# Patient Record
Sex: Female | Born: 1965
Health system: Southern US, Community
[De-identification: ages and names within clinical notes are randomized; demographics above are authoritative.]

## PROBLEM LIST (undated history)

## (undated) DIAGNOSIS — R600 Localized edema: Secondary | ICD-10-CM

## (undated) DIAGNOSIS — D649 Anemia, unspecified: Secondary | ICD-10-CM

## (undated) DIAGNOSIS — J45909 Unspecified asthma, uncomplicated: Secondary | ICD-10-CM

## (undated) DIAGNOSIS — R011 Cardiac murmur, unspecified: Secondary | ICD-10-CM

## (undated) DIAGNOSIS — M549 Dorsalgia, unspecified: Secondary | ICD-10-CM

## (undated) DIAGNOSIS — K219 Gastro-esophageal reflux disease without esophagitis: Secondary | ICD-10-CM

## (undated) HISTORY — DX: Anemia, unspecified: D64.9

## (undated) HISTORY — DX: Localized edema: R60.0

## (undated) HISTORY — DX: Dorsalgia, unspecified: M54.9

## (undated) HISTORY — PX: WISDOM TOOTH EXTRACTION: SHX21

## (undated) HISTORY — DX: Gastro-esophageal reflux disease without esophagitis: K21.9

## (undated) HISTORY — DX: Cardiac murmur, unspecified: R01.1

## (undated) HISTORY — DX: Unspecified asthma, uncomplicated: J45.909

---

## 1999-07-04 ENCOUNTER — Other Ambulatory Visit: Admission: RE | Admit: 1999-07-04 | Discharge: 1999-07-04 | Payer: Self-pay | Admitting: Family Medicine

## 2000-11-17 ENCOUNTER — Other Ambulatory Visit: Admission: RE | Admit: 2000-11-17 | Discharge: 2000-11-17 | Payer: Self-pay | Admitting: Family Medicine

## 2005-05-29 ENCOUNTER — Emergency Department (HOSPITAL_COMMUNITY): Admission: EM | Admit: 2005-05-29 | Discharge: 2005-05-29 | Payer: Self-pay | Admitting: Emergency Medicine

## 2006-03-27 ENCOUNTER — Emergency Department (HOSPITAL_COMMUNITY): Admission: EM | Admit: 2006-03-27 | Discharge: 2006-03-27 | Payer: Self-pay | Admitting: Family Medicine

## 2009-08-18 ENCOUNTER — Emergency Department (HOSPITAL_COMMUNITY): Admission: EM | Admit: 2009-08-18 | Discharge: 2009-08-18 | Payer: Self-pay | Admitting: Emergency Medicine

## 2010-09-07 ENCOUNTER — Encounter (INDEPENDENT_AMBULATORY_CARE_PROVIDER_SITE_OTHER): Payer: Self-pay | Admitting: Internal Medicine

## 2010-09-07 ENCOUNTER — Ambulatory Visit: Payer: Self-pay | Admitting: Cardiology

## 2010-09-07 ENCOUNTER — Ambulatory Visit (HOSPITAL_COMMUNITY)
Admission: RE | Admit: 2010-09-07 | Discharge: 2010-09-07 | Payer: Self-pay | Source: Home / Self Care | Admitting: Internal Medicine

## 2011-01-09 LAB — WET PREP, GENITAL
Trich, Wet Prep: NONE SEEN
Yeast Wet Prep HPF POC: NONE SEEN

## 2011-01-09 LAB — URINALYSIS, ROUTINE W REFLEX MICROSCOPIC
Glucose, UA: NEGATIVE mg/dL
Nitrite: NEGATIVE
Specific Gravity, Urine: 1.029 (ref 1.005–1.030)
pH: 6.5 (ref 5.0–8.0)

## 2011-01-09 LAB — GC/CHLAMYDIA PROBE AMP, GENITAL
Chlamydia, DNA Probe: NEGATIVE
GC Probe Amp, Genital: NEGATIVE

## 2011-04-17 ENCOUNTER — Ambulatory Visit: Payer: No Typology Code available for payment source | Attending: Sports Medicine | Admitting: Physical Therapy

## 2011-04-17 DIAGNOSIS — M545 Low back pain, unspecified: Secondary | ICD-10-CM | POA: Insufficient documentation

## 2011-04-17 DIAGNOSIS — IMO0001 Reserved for inherently not codable concepts without codable children: Secondary | ICD-10-CM | POA: Insufficient documentation

## 2011-04-17 DIAGNOSIS — M2560 Stiffness of unspecified joint, not elsewhere classified: Secondary | ICD-10-CM | POA: Insufficient documentation

## 2011-04-17 DIAGNOSIS — M542 Cervicalgia: Secondary | ICD-10-CM | POA: Insufficient documentation

## 2011-04-19 ENCOUNTER — Ambulatory Visit: Payer: No Typology Code available for payment source | Admitting: Rehabilitative and Restorative Service Providers"

## 2011-04-24 ENCOUNTER — Ambulatory Visit: Payer: No Typology Code available for payment source | Admitting: Physical Therapy

## 2011-04-25 ENCOUNTER — Ambulatory Visit: Payer: No Typology Code available for payment source | Admitting: Physical Therapy

## 2011-04-29 ENCOUNTER — Ambulatory Visit: Payer: No Typology Code available for payment source | Admitting: Physical Therapy

## 2011-05-01 ENCOUNTER — Ambulatory Visit: Payer: No Typology Code available for payment source | Admitting: Physical Therapy

## 2011-05-08 ENCOUNTER — Ambulatory Visit: Payer: No Typology Code available for payment source | Attending: Sports Medicine | Admitting: Physical Therapy

## 2011-05-08 DIAGNOSIS — M542 Cervicalgia: Secondary | ICD-10-CM | POA: Insufficient documentation

## 2011-05-08 DIAGNOSIS — M2560 Stiffness of unspecified joint, not elsewhere classified: Secondary | ICD-10-CM | POA: Insufficient documentation

## 2011-05-08 DIAGNOSIS — M545 Low back pain, unspecified: Secondary | ICD-10-CM | POA: Insufficient documentation

## 2011-05-08 DIAGNOSIS — IMO0001 Reserved for inherently not codable concepts without codable children: Secondary | ICD-10-CM | POA: Insufficient documentation

## 2011-05-10 ENCOUNTER — Ambulatory Visit: Payer: No Typology Code available for payment source | Admitting: Physical Therapy

## 2011-05-13 ENCOUNTER — Encounter: Payer: No Typology Code available for payment source | Admitting: Physical Therapy

## 2011-05-15 ENCOUNTER — Ambulatory Visit: Payer: No Typology Code available for payment source | Admitting: Physical Therapy

## 2011-05-20 ENCOUNTER — Ambulatory Visit: Payer: No Typology Code available for payment source | Admitting: Physical Therapy

## 2011-05-22 ENCOUNTER — Ambulatory Visit: Payer: No Typology Code available for payment source | Admitting: Physical Therapy

## 2011-05-28 ENCOUNTER — Ambulatory Visit: Payer: No Typology Code available for payment source | Admitting: Physical Therapy

## 2011-05-30 ENCOUNTER — Ambulatory Visit: Payer: No Typology Code available for payment source | Admitting: Physical Therapy

## 2011-10-09 ENCOUNTER — Other Ambulatory Visit: Payer: Self-pay | Admitting: Sports Medicine

## 2011-10-09 DIAGNOSIS — M545 Low back pain, unspecified: Secondary | ICD-10-CM

## 2011-10-15 ENCOUNTER — Inpatient Hospital Stay: Admission: RE | Admit: 2011-10-15 | Payer: No Typology Code available for payment source | Source: Ambulatory Visit

## 2011-11-01 ENCOUNTER — Ambulatory Visit: Payer: Medicaid Other | Admitting: Physical Medicine & Rehabilitation

## 2011-11-05 ENCOUNTER — Encounter: Payer: Medicaid Other | Attending: Physical Medicine & Rehabilitation

## 2011-11-05 ENCOUNTER — Ambulatory Visit (HOSPITAL_BASED_OUTPATIENT_CLINIC_OR_DEPARTMENT_OTHER): Payer: Medicaid Other | Admitting: Physical Medicine & Rehabilitation

## 2011-11-05 DIAGNOSIS — M545 Low back pain, unspecified: Secondary | ICD-10-CM

## 2011-11-05 DIAGNOSIS — G8921 Chronic pain due to trauma: Secondary | ICD-10-CM
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
## 2011-11-07 ENCOUNTER — Other Ambulatory Visit: Payer: No Typology Code available for payment source

## 2011-11-25 ENCOUNTER — Ambulatory Visit: Payer: Medicaid Other | Admitting: Physical Therapy

## 2011-12-10 ENCOUNTER — Encounter: Payer: Medicaid Other | Attending: Physical Medicine & Rehabilitation

## 2011-12-10 ENCOUNTER — Ambulatory Visit: Payer: Medicaid Other | Admitting: Physical Medicine & Rehabilitation

## 2011-12-10 DIAGNOSIS — M545 Low back pain, unspecified: Secondary | ICD-10-CM | POA: Insufficient documentation

## 2015-02-09 ENCOUNTER — Ambulatory Visit (INDEPENDENT_AMBULATORY_CARE_PROVIDER_SITE_OTHER): Payer: 59 | Admitting: Internal Medicine

## 2015-02-09 ENCOUNTER — Encounter: Payer: Self-pay | Admitting: Internal Medicine

## 2015-02-09 ENCOUNTER — Ambulatory Visit (INDEPENDENT_AMBULATORY_CARE_PROVIDER_SITE_OTHER)
Admission: RE | Admit: 2015-02-09 | Discharge: 2015-02-09 | Disposition: A | Discharge status: Home / Self Care | Payer: 59 | Source: Ambulatory Visit | Attending: Internal Medicine | Admitting: Internal Medicine

## 2015-02-09 VITALS — BP 120/74 | HR 68 | Temp 98.5°F | Ht 66.0 in | Wt 315.0 lb

## 2015-02-09 DIAGNOSIS — M25561 Pain in right knee: Secondary | ICD-10-CM | POA: Diagnosis not present

## 2015-02-09 DIAGNOSIS — J4520 Mild intermittent asthma, uncomplicated: Secondary | ICD-10-CM | POA: Diagnosis not present

## 2015-02-09 DIAGNOSIS — K219 Gastro-esophageal reflux disease without esophagitis: Secondary | ICD-10-CM

## 2015-02-09 DIAGNOSIS — J45909 Unspecified asthma, uncomplicated: Secondary | ICD-10-CM | POA: Insufficient documentation

## 2015-02-09 NOTE — Progress Notes (Signed)
Pre visit review using our clinic review tool, if applicable. No additional management support is needed unless otherwise documented below in the visit note. 

## 2015-02-09 NOTE — Assessment & Plan Note (Signed)
Diet controlled Discussed how weight loss would benefit her reflux

## 2015-02-09 NOTE — Assessment & Plan Note (Signed)
Has not affected her adult life

## 2015-02-09 NOTE — Patient Instructions (Signed)

## 2015-02-09 NOTE — Progress Notes (Signed)
HPI  Pt presents to the clinic today to establish care and for management of the conditions listed below. She has not had a PCP in many years.  Childhood asthma: Has not affected her as an adult. She does not use an inhaler.  GERD: She reports this is triggered by almost everything that she eats. She used to take Tums occasionally. She is now able to control it with her diet.   She is concerned about pain in her right knee. It seems worse first thing in the morning and after sitting for long periods of time. She reports it feels stiff but gets better throughout the day. She is not having any trouble walking. She denies injury to the area. She does feel like they are swelling. She has taken a friend's "fluid pill" with some relief.  Flu: never Tetanus: < 10 years ago Pap Smear: She thinks in 2011 Mammogram: She thinks in 2013 Colon Screening: never Vision Screening: yearly Dentist: no, she has dentures  Past Medical History  Diagnosis Date  . Childhood asthma   . GERD (gastroesophageal reflux disease)   . Heart murmur     Current Outpatient Prescriptions  Medication Sig Dispense Refill  . ferrous sulfate 325 (65 FE) MG tablet Take 325 mg by mouth daily with breakfast.     No current facility-administered medications for this visit.    Allergies  Allergen Reactions  . No Known Drug Allergy     History reviewed. No pertinent family history.  History   Social History  . Marital Status: Single    Spouse Name: N/A  . Number of Children: N/A  . Years of Education: N/A   Occupational History  . Not on file.   Social History Main Topics  . Smoking status: Never Smoker   . Smokeless tobacco: Never Used  . Alcohol Use: No  . Drug Use: Not on file  . Sexual Activity: Not on file   Other Topics Concern  . Not on file   Social History Narrative  . No narrative on file    ROS:  Constitutional: Denies fever, malaise, fatigue, headache or abrupt weight changes.   Respiratory: Denies difficulty breathing, shortness of breath, cough or sputum production.   Cardiovascular: Denies chest pain, chest tightness, palpitations or swelling in the hands or feet.  Gastrointestinal: Denies abdominal pain, bloating, constipation, diarrhea or blood in the stool.  Musculoskeletal: Pt reports knee pain. Denies decrease in range of motion, difficulty with gait, muscle pain.  Skin: Denies redness, rashes, lesions or ulcercations.  Neurological: Denies dizziness, difficulty with memory, difficulty with speech or problems with balance and coordination.  Psych: She denies anxiety, depression, SI/HI.  No other specific complaints in a complete review of systems (except as listed in HPI above).  PE:  BP 120/74 mmHg  Pulse 68  Temp(Src) 98.5 F (36.9 C) (Oral)  Ht 5\' 6"  (1.676 m)  Wt 315 lb (142.883 kg)  BMI 50.87 kg/m2  SpO2 98%  LMP 02/02/2015 Wt Readings from Last 3 Encounters:  02/09/15 315 lb (142.883 kg)    General: Appears her stated age, obese in NAD. Skin: Warm, dry and intact. Cardiovascular: Normal rate and rhythm. S1,S2 noted.  No murmur, rubs or gallops noted. Pulmonary/Chest: Normal effort and positive vesicular breath sounds. No respiratory distress. No wheezes, rales or ronchi noted.  Abdomen: Soft and nontender. Normal bowel sounds, no bruits noted. No distention or masses noted. Liver, spleen and kidneys non palpable. Musculoskeletal: Normal flexion and extension  of the right knee. Minimal swelling of the right lateral knee. No crepitus noted with ROM. No pain with palpation. No difficulty with gait.  Neurological: Alert and oriented.  Psychiatric: Mood normal but affect seems somewhat flat.   Assessment and Plan:  Right knee pain:  Worsened by her morbid obesity, encouraged her to work on diet and exercise Likely OA Will obtain xray of right knee today Advised her to try Ibuprofen  Make an appt for an annual exam

## 2015-02-23 ENCOUNTER — Encounter: Payer: Self-pay | Admitting: Internal Medicine

## 2015-02-23 ENCOUNTER — Other Ambulatory Visit (HOSPITAL_COMMUNITY)
Admission: RE | Admit: 2015-02-23 | Discharge: 2015-02-23 | Disposition: A | Discharge status: Home / Self Care | Payer: 59 | Source: Ambulatory Visit | Attending: Internal Medicine | Admitting: Internal Medicine

## 2015-02-23 ENCOUNTER — Ambulatory Visit (INDEPENDENT_AMBULATORY_CARE_PROVIDER_SITE_OTHER): Payer: 59 | Admitting: Internal Medicine

## 2015-02-23 VITALS — BP 122/84 | HR 68 | Temp 98.4°F | Ht 66.0 in | Wt 310.0 lb

## 2015-02-23 DIAGNOSIS — Z01419 Encounter for gynecological examination (general) (routine) without abnormal findings: Secondary | ICD-10-CM | POA: Diagnosis present

## 2015-02-23 DIAGNOSIS — Z1151 Encounter for screening for human papillomavirus (HPV): Secondary | ICD-10-CM | POA: Insufficient documentation

## 2015-02-23 DIAGNOSIS — Z1239 Encounter for other screening for malignant neoplasm of breast: Secondary | ICD-10-CM

## 2015-02-23 DIAGNOSIS — Z0000 Encounter for general adult medical examination without abnormal findings: Secondary | ICD-10-CM

## 2015-02-23 DIAGNOSIS — Z124 Encounter for screening for malignant neoplasm of cervix: Secondary | ICD-10-CM | POA: Diagnosis not present

## 2015-02-23 LAB — COMPREHENSIVE METABOLIC PANEL
ALBUMIN: 4 g/dL (ref 3.5–5.2)
ALT: 13 U/L (ref 0–35)
AST: 17 U/L (ref 0–37)
Alkaline Phosphatase: 53 U/L (ref 39–117)
BILIRUBIN TOTAL: 0.4 mg/dL (ref 0.2–1.2)
BUN: 16 mg/dL (ref 6–23)
CHLORIDE: 104 meq/L (ref 96–112)
CO2: 25 meq/L (ref 19–32)
Calcium: 9.3 mg/dL (ref 8.4–10.5)
Creatinine, Ser: 0.92 mg/dL (ref 0.40–1.20)
GFR: 82.71 mL/min (ref >60.00)
Glucose, Bld: 85 mg/dL (ref 70–99)
Potassium: 3.8 mEq/L (ref 3.5–5.1)
SODIUM: 135 meq/L (ref 135–145)
TOTAL PROTEIN: 7.5 g/dL (ref 6.0–8.3)

## 2015-02-23 LAB — CBC
HEMATOCRIT: 34 % — AB (ref 36.0–46.0)
HEMOGLOBIN: 10.9 g/dL — AB (ref 12.0–15.0)
MCHC: 32.1 g/dL (ref 30.0–36.0)
MCV: 83.3 fl (ref 78.0–100.0)
PLATELETS: 346 10*3/uL (ref 150.0–400.0)
RBC: 4.09 Mil/uL (ref 3.87–5.11)
RDW: 15.5 % (ref 11.5–15.5)
WBC: 5.7 10*3/uL (ref 4.0–10.5)

## 2015-02-23 LAB — HEMOGLOBIN A1C: Hgb A1c MFr Bld: 5.8 % (ref 4.6–6.5)

## 2015-02-23 LAB — TSH: TSH: 0.79 u[IU]/mL (ref 0.35–4.50)

## 2015-02-23 LAB — LIPID PANEL
CHOLESTEROL: 161 mg/dL (ref 0–200)
HDL: 58.8 mg/dL (ref >39.00)
LDL Cholesterol: 83 mg/dL (ref 0–99)
NonHDL: 102.2
TRIGLYCERIDES: 98 mg/dL (ref 0.0–149.0)
Total CHOL/HDL Ratio: 3
VLDL: 19.6 mg/dL (ref 0.0–40.0)

## 2015-02-23 NOTE — Progress Notes (Signed)
Pre visit review using our clinic review tool, if applicable. No additional management support is needed unless otherwise documented below in the visit note. 

## 2015-02-23 NOTE — Progress Notes (Signed)
Subjective:    Patient ID: Wendy Wagner, female    DOB: [birth year], 49 y.o.   MRN: [id number]  HPI  Pt presents to the clinic today for her annual exam.  Flu: never Tetanus: < 10 years ago Pap Smear: She thinks in 2011 Mammogram: She thinks in 2013 Colon Screening: never Vision Screening: yearly Dentist: no, she has dentures  Review of Systems      Past Medical History  Diagnosis Date  . Childhood asthma   . GERD (gastroesophageal reflux disease)   . Heart murmur     Current Outpatient Prescriptions  Medication Sig Dispense Refill  . ferrous sulfate 325 (65 FE) MG tablet Take 325 mg by mouth daily with breakfast.     No current facility-administered medications for this visit.    Allergies  Allergen Reactions  . No Known Drug Allergy     Family History  Problem Relation Age of Onset  . Cancer Mother     brain  . Cancer Father     brain, lung  . Diabetes Neg Hx   . Heart disease Neg Hx   . Stroke Neg Hx     History   Social History  . Marital Status: Single    Spouse Name: N/A  . Number of Children: N/A  . Years of Education: N/A   Occupational History  . Not on file.   Social History Main Topics  . Smoking status: Never Smoker   . Smokeless tobacco: Never Used  . Alcohol Use: No  . Drug Use: No  . Sexual Activity: Not Currently   Other Topics Concern  . Not on file   Social History Narrative     Constitutional: Denies fever, malaise, fatigue, headache or abrupt weight changes.  HEENT: Denies eye pain, eye redness, ear pain, ringing in the ears, wax buildup, runny nose, nasal congestion, bloody nose, or sore throat. Respiratory: Denies difficulty breathing, shortness of breath, cough or sputum production.   Cardiovascular: Denies chest pain, chest tightness, palpitations or swelling in the hands or feet.  Gastrointestinal: Denies abdominal pain, bloating, constipation, diarrhea or blood in the stool.  GU: Denies urgency, frequency,  pain with urination, burning sensation, blood in urine, odor or discharge. Musculoskeletal: Denies decrease in range of motion, difficulty with gait, muscle pain or joint pain and swelling.  Skin: Denies redness, rashes, lesions or ulcercations.  Neurological: Denies dizziness, difficulty with memory, difficulty with speech or problems with balance and coordination.  Psych: Denies anxiety, depression, SI/HI.  No other specific complaints in a complete review of systems (except as listed in HPI above).  Objective:   Physical Exam   BP 122/84 mmHg  Pulse 68  Temp(Src) 98.4 F (36.9 C) (Oral)  Ht 5\' 6"  (1.676 m)  Wt 310 lb (140.615 kg)  BMI 50.06 kg/m2  SpO2 98%  LMP 02/02/2015 Wt Readings from Last 3 Encounters:  02/23/15 310 lb (140.615 kg)  02/09/15 315 lb (142.883 kg)    General: Appears her stated age, obese in NAD. Skin: Warm, dry and intact. No rashes, lesions or ulcerations noted. HEENT: Head: normal shape and size; Eyes: sclera white, no icterus, conjunctiva pink, PERRLA and EOMs intact; Ears: Tm's gray and intact, normal light reflex; Throat/Mouth: Teeth present, mucosa pink and moist, no exudate, lesions or ulcerations noted.  Neck: Neck supple, trachea midline. No masses, lumps or thyromegaly present.  Cardiovascular: Normal rate and rhythm. S1,S2 noted.  Murmur noted. No rubs or gallops noted. No JVD or BLE  edema. No carotid bruits noted. Pulmonary/Chest: Normal effort and positive vesicular breath sounds. No respiratory distress. No wheezes, rales or ronchi noted.  Abdomen: Soft and nontender. Normal bowel sounds, no bruits noted. No distention or masses noted. Liver, spleen and kidneys non palpable. Musculoskeletal: Strength 5/5 BUE/BLE.  No signs of joint swelling. No difficulty with gait.  Neurological: Alert and oriented. Cranial nerves II-XII grossly intact. Coordination normal. Psychiatric: Mood and affect normal. Behavior is normal. Judgment and thought content  normal.       Assessment & Plan:   Preventative Health Maintenance:  Encouraged her to work on diet and exercise She declines flu shot Tetanus UTD Pap smear today Will order Mammogram, she will call [facility] to schedule Will check CBC, CMET, Lipid, TSH, and A1C today She declines colonoscopy, but is agreeable to IFOB  RTC in 1 year or sooner if needed

## 2015-02-23 NOTE — Addendum Note (Signed)
Addended by: Lurlean Nanny on: 02/23/2015 03:04 PM   Modules accepted: Orders

## 2015-02-23 NOTE — Patient Instructions (Signed)

## 2015-02-24 LAB — CYTOLOGY - PAP

## 2015-03-30 ENCOUNTER — Ambulatory Visit (INDEPENDENT_AMBULATORY_CARE_PROVIDER_SITE_OTHER): Payer: 59 | Admitting: Internal Medicine

## 2015-03-30 ENCOUNTER — Encounter: Payer: Self-pay | Admitting: Internal Medicine

## 2015-03-30 VITALS — BP 126/78 | HR 70 | Temp 98.5°F | Wt 299.0 lb

## 2015-03-30 DIAGNOSIS — R609 Edema, unspecified: Secondary | ICD-10-CM | POA: Diagnosis not present

## 2015-03-30 DIAGNOSIS — S29012A Strain of muscle and tendon of back wall of thorax, initial encounter: Secondary | ICD-10-CM

## 2015-03-30 MED ORDER — HYDROCHLOROTHIAZIDE 12.5 MG PO CAPS: 12.5000 mg | ORAL_CAPSULE | Freq: Every day | ORAL | Status: DC

## 2015-03-30 MED ORDER — METHOCARBAMOL 500 MG PO TABS: 500.0000 mg | ORAL_TABLET | Freq: Every evening | ORAL | Status: DC | PRN

## 2015-03-30 NOTE — Progress Notes (Signed)
Subjective:    Patient ID: Wendy Wagner, female    DOB: 1966-08-27, 49 y.o.   MRN: [id number]  HPI  Pt presents to the clinic today with c/o back pain. The pain is worse on the right side it. She describes the pain as achy but can be sharp and stabbing at times. It is constant but seesms worse with certain movements like changing from a sitting to a standing position. She denies numbness and tingling. She denies any injury that she is aware of. It started at work just out of the blue. She has taken Aleve without relief.  She also c/o swelling in her feet and ankles. It gets worse throughout the day, but is sometimes still present when she wakes up in the morning. It is not painful but she has trouble getting into her shoes. She reports she has been on a fluid pill as needed for this in the past and wants to know if she can get a refill today.  Review of Systems      Past Medical History  Diagnosis Date  . Childhood asthma   . GERD (gastroesophageal reflux disease)   . Heart murmur     Current Outpatient Prescriptions  Medication Sig Dispense Refill  . ferrous sulfate 325 (65 FE) MG tablet Take 325 mg by mouth daily with breakfast.     No current facility-administered medications for this visit.    Allergies  Allergen Reactions  . No Known Drug Allergy     Family History  Problem Relation Age of Onset  . Cancer Mother     brain  . Cancer Father     brain, lung  . Diabetes Neg Hx   . Heart disease Neg Hx   . Stroke Neg Hx     History   Social History  . Marital Status: Single    Spouse Name: N/A  . Number of Children: N/A  . Years of Education: N/A   Occupational History  . Not on file.   Social History Main Topics  . Smoking status: Never Smoker   . Smokeless tobacco: Never Used  . Alcohol Use: No  . Drug Use: No  . Sexual Activity: Not Currently   Other Topics Concern  . Not on file   Social History Narrative     Constitutional: Denies fever,  malaise, fatigue, headache or abrupt weight changes.  Respiratory: Denies difficulty breathing, shortness of breath, cough or sputum production.   Cardiovascular: Pt reports swelling in the feet. Denies chest pain, chest tightness, palpitations or swelling in the hands.  Gastrointestinal: Denies abdominal pain, bloating, constipation, diarrhea or blood in the stool.  GU: Denies urgency, frequency, pain with urination, burning sensation, blood in urine, odor or discharge. Musculoskeletal: Pt reports back pain. Denies decrease in range of motion, difficulty with gait, or joint pain and swelling.  Skin: Denies redness, rashes, lesions or ulcercations.    No other specific complaints in a complete review of systems (except as listed in HPI above).  Objective:   Physical Exam   BP 126/78 mmHg  Pulse 70  Temp(Src) 98.5 F (36.9 C) (Oral)  Wt 299 lb (135.626 kg)  SpO2 98% Wt Readings from Last 3 Encounters:  03/30/15 299 lb (135.626 kg)  02/23/15 310 lb (140.615 kg)  02/09/15 315 lb (142.883 kg)    General: Appears her stated age, obese in NAD. Cardiovascular: Normal rate and rhythm. S1,S2 noted.  No murmur, rubs or gallops noted. 2+ non pitting BLE  edema.  Pulmonary/Chest: Normal effort and positive vesicular breath sounds. No respiratory distress. No wheezes, rales or ronchi noted.  Abdomen: Soft and nontender. Normal bowel sounds, no bruits noted. No distention or masses noted. Liver, spleen and kidneys non palpable. Musculoskeletal: Decreased flexion. Normal extension and rotation. No pain with palpation of the thoracic and lumbar spine. Pain with palpation of the right para thoracic muscles. No difficulty with gait.  Neurological: Alert and oriented. Sensation intact to BLE.  BMET    Component Value Date/Time   NA 135 02/23/2015 1512   K 3.8 02/23/2015 1512   CL 104 02/23/2015 1512   CO2 25 02/23/2015 1512   GLUCOSE 85 02/23/2015 1512   BUN 16 02/23/2015 1512   CREATININE  0.92 02/23/2015 1512   CALCIUM 9.3 02/23/2015 1512    Lipid Panel     Component Value Date/Time   CHOL 161 02/23/2015 1512   TRIG 98.0 02/23/2015 1512   HDL 58.80 02/23/2015 1512   CHOLHDL 3 02/23/2015 1512   VLDL 19.6 02/23/2015 1512   LDLCALC 83 02/23/2015 1512    CBC    Component Value Date/Time   WBC 5.7 02/23/2015 1512   RBC 4.09 02/23/2015 1512   HGB 10.9* 02/23/2015 1512   HCT 34.0* 02/23/2015 1512   PLT 346.0 02/23/2015 1512   MCV 83.3 02/23/2015 1512   MCHC 32.1 02/23/2015 1512   RDW 15.5 02/23/2015 1512    Hgb A1C Lab Results  Component Value Date   HGBA1C 5.8 02/23/2015        Assessment & Plan:   Muscles strain of right back:  Take Aleve twice daily eRx for Robaxin 500 mg QHS Stretching exercises given Reassurance given that this should resolve with time  Peripheral edema:  eRx for HCTZ 12.5 mg to be taken daily prn for swelling  RTC as needed or if symptoms persist or worsen

## 2015-03-30 NOTE — Progress Notes (Signed)
Pre visit review using our clinic review tool, if applicable. No additional management support is needed unless otherwise documented below in the visit note. 

## 2015-03-30 NOTE — Patient Instructions (Signed)
Back Exercises These exercises may help you when beginning to rehabilitate your injury. Your symptoms may resolve with or without further involvement from your physician, physical therapist or athletic trainer. While completing these exercises, remember:   Restoring tissue flexibility helps normal motion to return to the joints. This allows healthier, less painful movement and activity.  An effective stretch should be held for at least 30 seconds.  A stretch should never be painful. You should only feel a gentle lengthening or release in the stretched tissue. STRETCH - Extension, Prone on Elbows   Lie on your stomach on the floor, a bed will be too soft. Place your palms about shoulder width apart and at the height of your head.  Place your elbows under your shoulders. If this is too painful, stack pillows under your chest.  Allow your body to relax so that your hips drop lower and make contact more completely with the floor.  Hold this position for __________ seconds.  Slowly return to lying flat on the floor. Repeat __________ times. Complete this exercise __________ times per day.  RANGE OF MOTION - Extension, Prone Press Ups   Lie on your stomach on the floor, a bed will be too soft. Place your palms about shoulder width apart and at the height of your head.  Keeping your back as relaxed as possible, slowly straighten your elbows while keeping your hips on the floor. You may adjust the placement of your hands to maximize your comfort. As you gain motion, your hands will come more underneath your shoulders.  Hold this position __________ seconds.  Slowly return to lying flat on the floor. Repeat __________ times. Complete this exercise __________ times per day.  RANGE OF MOTION- Quadruped, Neutral Spine   Assume a hands and knees position on a firm surface. Keep your hands under your shoulders and your knees under your hips. You may place padding under your knees for  comfort.  Drop your head and point your tail bone toward the ground below you. This will round out your low back like an angry cat. Hold this position for __________ seconds.  Slowly lift your head and release your tail bone so that your back sags into a large arch, like an old horse.  Hold this position for __________ seconds.  Repeat this until you feel limber in your low back.  Now, find your "sweet spot." This will be the most comfortable position somewhere between the two previous positions. This is your neutral spine. Once you have found this position, tense your stomach muscles to support your low back.  Hold this position for __________ seconds. Repeat __________ times. Complete this exercise __________ times per day.  STRETCH - Flexion, Single Knee to Chest   Lie on a firm bed or floor with both legs extended in front of you.  Keeping one leg in contact with the floor, bring your opposite knee to your chest. Hold your leg in place by either grabbing behind your thigh or at your knee.  Pull until you feel a gentle stretch in your low back. Hold __________ seconds.  Slowly release your grasp and repeat the exercise with the opposite side. Repeat __________ times. Complete this exercise __________ times per day.  STRETCH - Hamstrings, Standing  Stand or sit and extend your right / left leg, placing your foot on a chair or foot stool  Keeping a slight arch in your low back and your hips straight forward.  Lead with your chest and   lean forward at the waist until you feel a gentle stretch in the back of your right / left knee or thigh. (When done correctly, this exercise requires leaning only a small distance.)  Hold this position for __________ seconds. Repeat __________ times. Complete this stretch __________ times per day. STRENGTHENING - Deep Abdominals, Pelvic Tilt   Lie on a firm bed or floor. Keeping your legs in front of you, bend your knees so they are both pointed  toward the ceiling and your feet are flat on the floor.  Tense your lower abdominal muscles to press your low back into the floor. This motion will rotate your pelvis so that your tail bone is scooping upwards rather than pointing at your feet or into the floor.  With a gentle tension and even breathing, hold this position for __________ seconds. Repeat __________ times. Complete this exercise __________ times per day.  STRENGTHENING - Abdominals, Crunches   Lie on a firm bed or floor. Keeping your legs in front of you, bend your knees so they are both pointed toward the ceiling and your feet are flat on the floor. Cross your arms over your chest.  Slightly tip your chin down without bending your neck.  Tense your abdominals and slowly lift your trunk high enough to just clear your shoulder blades. Lifting higher can put excessive stress on the low back and does not further strengthen your abdominal muscles.  Control your return to the starting position. Repeat __________ times. Complete this exercise __________ times per day.  STRENGTHENING - Quadruped, Opposite UE/LE Lift   Assume a hands and knees position on a firm surface. Keep your hands under your shoulders and your knees under your hips. You may place padding under your knees for comfort.  Find your neutral spine and gently tense your abdominal muscles so that you can maintain this position. Your shoulders and hips should form a rectangle that is parallel with the floor and is not twisted.  Keeping your trunk steady, lift your right hand no higher than your shoulder and then your left leg no higher than your hip. Make sure you are not holding your breath. Hold this position __________ seconds.  Continuing to keep your abdominal muscles tense and your back steady, slowly return to your starting position. Repeat with the opposite arm and leg. Repeat __________ times. Complete this exercise __________ times per day. Document Released:  10/11/2005 Document Revised: 12/16/2011 Document Reviewed: 01/05/2009 Exit[employer] Patient Information 2015 [employer]. This information is not intended to replace advice given to you by your health care provider. Make sure you discuss any questions you have with your health care provider.  

## 2015-08-10 ENCOUNTER — Ambulatory Visit (INDEPENDENT_AMBULATORY_CARE_PROVIDER_SITE_OTHER): Payer: 59 | Admitting: Family Medicine

## 2015-08-10 ENCOUNTER — Encounter: Payer: Self-pay | Admitting: Family Medicine

## 2015-08-10 VITALS — BP 104/60 | HR 71 | Temp 98.6°F | Wt 308.5 lb

## 2015-08-10 DIAGNOSIS — M25512 Pain in left shoulder: Secondary | ICD-10-CM | POA: Diagnosis not present

## 2015-08-10 DIAGNOSIS — M545 Low back pain, unspecified: Secondary | ICD-10-CM

## 2015-08-10 MED ORDER — CYCLOBENZAPRINE HCL 10 MG PO TABS
5.0000 mg | ORAL_TABLET | Freq: Three times a day (TID) | ORAL | Status: DC | PRN
Start: 2015-08-10 — End: 2016-02-13

## 2015-08-10 MED ORDER — IBUPROFEN 600 MG PO TABS: 600.0000 mg | ORAL_TABLET | Freq: Three times a day (TID) | ORAL | Status: DC | PRN
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
## 2015-08-10 NOTE — Patient Instructions (Addendum)
Ibuprofen with food for pain.  Muscle relaxer as needed.  Sedation caution.  Use the shoulder exercises and gradually try to stretch your hips and back.  Take care.

## 2015-08-11 DIAGNOSIS — S335XXA Sprain of ligaments of lumbar spine, initial encounter: Secondary | ICD-10-CM | POA: Insufficient documentation

## 2015-08-11 DIAGNOSIS — M25519 Pain in unspecified shoulder: Secondary | ICD-10-CM | POA: Insufficient documentation

## 2015-08-11 NOTE — Assessment & Plan Note (Signed)
Ibuprofen, flexeril, routine cautions.  Likely SI irritation with muscle spasm. Okay for outpatient f/u.  D/w pt. She agrees.  See AVS.

## 2015-08-11 NOTE — Assessment & Plan Note (Signed)
Likely cuff irritation, no arm drop.  D/w pt about anatomy and exercises, handout given and d/w pt.  Ibuprofen and home exercises, f/u prn.

## 2015-08-18 ENCOUNTER — Encounter: Payer: Self-pay | Admitting: Internal Medicine

## 2015-08-18 ENCOUNTER — Encounter: Payer: Self-pay | Admitting: *Deleted

## 2015-08-18 ENCOUNTER — Ambulatory Visit (INDEPENDENT_AMBULATORY_CARE_PROVIDER_SITE_OTHER): Payer: 59 | Admitting: Internal Medicine

## 2015-08-18 VITALS — BP 120/70 | HR 64 | Temp 97.5°F | Wt 311.0 lb

## 2015-08-18 DIAGNOSIS — S335XXD Sprain of ligaments of lumbar spine, subsequent encounter: Secondary | ICD-10-CM | POA: Diagnosis not present

## 2015-08-18 NOTE — Patient Instructions (Signed)
Please increase the ibuprofen to 600mg  three times daily. Try to find a better support bra.

## 2015-08-18 NOTE — Assessment & Plan Note (Signed)
Persistent problems for years--but ongoing exacerbation Will increase the ibuprofen Start PT Discussed getting better bra--as weight of breasts is probably adding to this

## 2015-08-18 NOTE — Progress Notes (Signed)
Pre visit review using our clinic review tool, if applicable. No additional management support is needed unless otherwise documented below in the visit note. 
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
## 2015-09-25 ENCOUNTER — Ambulatory Visit (INDEPENDENT_AMBULATORY_CARE_PROVIDER_SITE_OTHER): Payer: 59 | Admitting: Internal Medicine

## 2015-09-25 ENCOUNTER — Encounter: Payer: Self-pay | Admitting: Internal Medicine

## 2015-09-25 VITALS — BP 134/86 | HR 60 | Temp 98.3°F | Wt 318.0 lb

## 2015-09-25 DIAGNOSIS — J069 Acute upper respiratory infection, unspecified: Secondary | ICD-10-CM

## 2015-09-25 MED ORDER — HYDROCODONE-HOMATROPINE 5-1.5 MG PO TABS: 1.0000 | ORAL_TABLET | Freq: Three times a day (TID) | ORAL | Status: DC | PRN

## 2015-09-25 MED ORDER — AZITHROMYCIN 250 MG PO TABS: ORAL_TABLET | ORAL | Status: DC

## 2015-09-25 NOTE — Progress Notes (Signed)
Pre visit review using our clinic review tool, if applicable. No additional management support is needed unless otherwise documented below in the visit note. 

## 2015-09-25 NOTE — Progress Notes (Signed)
HPI  Pt presents to the clinic today with c/o cough and chest congestion. This started 9 days ago. The cough is productive of green/brown mucous. She denies shortness of breath. She has had a runny nose, with clear mucous. She denies fever, but has had chills and body aches. She has tried Research [employer], political party with minimal relief. She does have a history of childhood asthma but has not had to use an inhaler. She has had sick contacts.  Review of Systems      Past Medical History  Diagnosis Date  . Childhood asthma   . GERD (gastroesophageal reflux disease)   . Heart murmur     Family History  Problem Relation Age of Onset  . Cancer Mother     brain  . Cancer Father     brain, lung  . Diabetes Neg Hx   . Heart disease Neg Hx   . Stroke Neg Hx     Social History   Social History  . Marital Status: Single    Spouse Name: N/A  . Number of Children: N/A  . Years of Education: N/A   Occupational History  . Not on file.   Social History Main Topics  . Smoking status: Never Smoker   . Smokeless tobacco: Never Used  . Alcohol Use: No  . Drug Use: No  . Sexual Activity: Not Currently   Other Topics Concern  . Not on file   Social History Narrative    Allergies  Allergen Reactions  . No Known Drug Allergy      Constitutional: Positive fatigue. Denies headache, fever or abrupt weight changes.  HEENT:  Positive runny nose. Denies eye redness, eye pain, pressure behind the eyes, facial pain, nasal congestion, ear pain, ringing in the ears, wax buildup, or sore throat. Respiratory: Positive cough. Denies difficulty breathing or shortness of breath.  Cardiovascular: Denies chest pain, chest tightness, palpitations or swelling in the hands or feet.   No other specific complaints in a complete review of systems (except as listed in HPI above).  Objective:   BP 134/86 mmHg  Pulse 60  Temp(Src) 98.3 F (36.8 C) (Oral)  Wt 318 lb (144.244 kg) Wt Readings from  Last 3 Encounters:  09/25/15 318 lb (144.244 kg)  08/18/15 311 lb (141.069 kg)  08/10/15 308 lb 8 oz (139.935 kg)     General: Appears her stated age, in NAD. HEENT: Head: normal shape and size, no sinus tenderness noted; Eyes: sclera white, no icterus, conjunctiva pink; Ears: Tm's pink but intact, normal light reflex, + serous effusion bilaterally; Nose: mucosa boggy and moist, septum midline; Throat/Mouth: + PND. Teeth present, mucosa erythematous and moist, no exudate noted, no lesions or ulcerations noted.  Neck: No cervical lymphadenopathy.  Cardiovascular: Normal rate and rhythm. S1,S2 noted.  Pulmonary/Chest: Normal effort and positive vesicular breath sounds. No respiratory distress. No wheezes, rales or ronchi noted.      Assessment & Plan:   Upper Respiratory Infection:  Given duration of symptoms, will treat with abx Get some rest and drink plenty of water Flonase daily x 1 week for runny nose eRx for Azithromax x 5 days Rx for Hycodan cough tablets  RTC as needed or if symptoms persist.

## 2015-09-25 NOTE — Patient Instructions (Signed)
Upper Respiratory Infection, Adult Most upper respiratory infections (URIs) are a viral infection of the air passages leading to the lungs. A URI affects the nose, throat, and upper air passages. The most common type of URI is nasopharyngitis and is typically referred to as "the common cold." URIs run their course and usually go away on their own. Most of the time, a URI does not require medical attention, but sometimes a bacterial infection in the upper airways can follow a viral infection. This is called a secondary infection. Sinus and middle ear infections are common types of secondary upper respiratory infections. Bacterial pneumonia can also complicate a URI. A URI can worsen asthma and chronic obstructive pulmonary disease (COPD). Sometimes, these complications can require emergency medical care and may be life threatening.  CAUSES Almost all URIs are caused by viruses. A virus is a type of germ and can spread from one person to another.  RISKS FACTORS You may be at risk for a URI if:   You smoke.   You have chronic heart or lung disease.  You have a weakened defense (immune) system.   You are very young or very old.   You have nasal allergies or asthma.  You work in crowded or poorly ventilated areas.  You work in health care facilities or schools. SIGNS AND SYMPTOMS  Symptoms typically develop 2-3 days after you come in contact with a cold virus. Most viral URIs last 7-10 days. However, viral URIs from the influenza virus (flu virus) can last 14-18 days and are typically more severe. Symptoms may include:   Runny or stuffy (congested) nose.   Sneezing.   Cough.   Sore throat.   Headache.   Fatigue.   Fever.   Loss of appetite.   Pain in your forehead, behind your eyes, and over your cheekbones (sinus pain).  Muscle aches.  DIAGNOSIS  Your health care provider may diagnose a URI by:  Physical exam.  Tests to check that your symptoms are not due to  another condition such as:  Strep throat.  Sinusitis.  Pneumonia.  Asthma. TREATMENT  A URI goes away on its own with time. It cannot be cured with medicines, but medicines may be prescribed or recommended to relieve symptoms. Medicines may help:  Reduce your fever.  Reduce your cough.  Relieve nasal congestion. HOME CARE INSTRUCTIONS   Take medicines only as directed by your health care provider.   Gargle warm saltwater or take cough drops to comfort your throat as directed by your health care provider.  Use a warm mist humidifier or inhale steam from a shower to increase air moisture. This may make it easier to breathe.  Drink enough fluid to keep your urine clear or pale yellow.   Eat soups and other clear broths and maintain good nutrition.   Rest as needed.   Return to work when your temperature has returned to normal or as your health care provider advises. You may need to stay home longer to avoid infecting others. You can also use a face mask and careful hand washing to prevent spread of the virus.  Increase the usage of your inhaler if you have asthma.   Do not use any tobacco products, including cigarettes, chewing tobacco, or electronic cigarettes. If you need help quitting, ask your health care provider. PREVENTION  The best way to protect yourself from getting a cold is to practice good hygiene.   Avoid oral or hand contact with people with cold   symptoms.   Wash your hands often if contact occurs.  There is no clear evidence that vitamin C, vitamin E, echinacea, or exercise reduces the chance of developing a cold. However, it is always recommended to get plenty of rest, exercise, and practice good nutrition.  SEEK MEDICAL CARE IF:   You are getting worse rather than better.   Your symptoms are not controlled by medicine.   You have chills.  You have worsening shortness of breath.  You have brown or red mucus.  You have yellow or brown nasal  discharge.  You have pain in your face, especially when you bend forward.  You have a fever.  You have swollen neck glands.  You have pain while swallowing.  You have white areas in the back of your throat. SEEK IMMEDIATE MEDICAL CARE IF:   You have severe or persistent:  Headache.  Ear pain.  Sinus pain.  Chest pain.  You have chronic lung disease and any of the following:  Wheezing.  Prolonged cough.  Coughing up blood.  A change in your usual mucus.  You have a stiff neck.  You have changes in your:  Vision.  Hearing.  Thinking.  Mood. MAKE SURE YOU:   Understand these instructions.  Will watch your condition.  Will get help right away if you are not doing well or get worse.   This information is not intended to replace advice given to you by your health care provider. Make sure you discuss any questions you have with your health care provider.   Document Released: 03/19/2001 Document Revised: 02/07/2015 Document Reviewed: 12/29/2013 Elsevier Interactive Patient Education 2016 [employer]  

## 2015-09-26 ENCOUNTER — Telehealth: Payer: Self-pay

## 2015-09-26 NOTE — Telephone Encounter (Signed)
That is the cheapest one I am aware of that has codeine in it. If it is too expensive, I recommend Delsym or Nyquil OTC.

## 2015-09-26 NOTE — Telephone Encounter (Signed)
Pt left v/m; pt was seen 09/25/15 and the hycodan was too expensive and pt request less expensive med for cough. Pt request cb.

## 2015-09-26 NOTE — Telephone Encounter (Signed)
Left detailed msg on VM per HIPAA  

## 2016-02-13 ENCOUNTER — Ambulatory Visit (INDEPENDENT_AMBULATORY_CARE_PROVIDER_SITE_OTHER): Payer: BLUE CROSS/BLUE SHIELD | Admitting: Internal Medicine

## 2016-02-13 ENCOUNTER — Encounter: Payer: Self-pay | Admitting: Internal Medicine

## 2016-02-13 VITALS — BP 122/72 | HR 82 | Temp 98.5°F | Wt 329.5 lb

## 2016-02-13 DIAGNOSIS — R609 Edema, unspecified: Secondary | ICD-10-CM | POA: Diagnosis not present

## 2016-02-13 DIAGNOSIS — M546 Pain in thoracic spine: Secondary | ICD-10-CM | POA: Diagnosis not present

## 2016-02-13 MED ORDER — METHOCARBAMOL 500 MG PO TABS: 500.0000 mg | ORAL_TABLET | Freq: Three times a day (TID) | ORAL | Status: DC | PRN

## 2016-02-13 MED ORDER — IBUPROFEN 800 MG PO TABS: 800.0000 mg | ORAL_TABLET | Freq: Three times a day (TID) | ORAL | Status: DC | PRN
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
## 2016-02-13 NOTE — Patient Instructions (Signed)

## 2016-02-13 NOTE — Progress Notes (Signed)
Pre visit review using our clinic review tool, if applicable. No additional management support is needed unless otherwise documented below in the visit note. 

## 2016-02-23 ENCOUNTER — Ambulatory Visit (INDEPENDENT_AMBULATORY_CARE_PROVIDER_SITE_OTHER): Payer: BLUE CROSS/BLUE SHIELD

## 2016-02-23 ENCOUNTER — Ambulatory Visit (INDEPENDENT_AMBULATORY_CARE_PROVIDER_SITE_OTHER): Payer: BLUE CROSS/BLUE SHIELD | Admitting: Podiatry

## 2016-02-23 ENCOUNTER — Encounter: Payer: Self-pay | Admitting: Podiatry

## 2016-02-23 VITALS — BP 135/71 | HR 70 | Resp 18

## 2016-02-23 DIAGNOSIS — M7730 Calcaneal spur, unspecified foot: Secondary | ICD-10-CM | POA: Diagnosis not present

## 2016-02-23 DIAGNOSIS — R52 Pain, unspecified: Secondary | ICD-10-CM | POA: Diagnosis not present

## 2016-02-23 DIAGNOSIS — M722 Plantar fascial fibromatosis: Secondary | ICD-10-CM

## 2016-02-23 MED ORDER — METHYLPREDNISOLONE 4 MG PO TBPK: ORAL_TABLET | ORAL | Status: DC

## 2016-02-23 MED ORDER — MELOXICAM 15 MG PO TABS
15.0000 mg | ORAL_TABLET | Freq: Every day | ORAL | Status: DC
Start: 2016-02-23 — End: 2016-05-29

## 2016-02-23 NOTE — Patient Instructions (Signed)
Start taking the medrol dose pack once this is complete you can start the meloxicam (mobic) but do not take together. Do not take ibuprofen.   Plantar Fasciitis With Rehab The plantar fascia is a fibrous, ligament-like, soft-tissue structure that spans the bottom of the foot. Plantar fasciitis, also called heel spur syndrome, is a condition that causes pain in the foot due to inflammation of the tissue. SYMPTOMS   Pain and tenderness on the underneath side of the foot.  Pain that worsens with standing or walking. CAUSES  Plantar fasciitis is caused by irritation and injury to the plantar fascia on the underneath side of the foot. Common mechanisms of injury include:  Direct trauma to bottom of the foot.  Damage to a small nerve that runs under the foot where the main fascia attaches to the heel bone.  Stress placed on the plantar fascia due to bone spurs. RISK INCREASES WITH:   Activities that place stress on the plantar fascia (running, jumping, pivoting, or cutting).  Poor strength and flexibility.  Improperly fitted shoes.  Tight calf muscles.  Flat feet.  Failure to warm-up properly before activity.  Obesity. PREVENTION  Warm up and stretch properly before activity.  Allow for adequate recovery between workouts.  Maintain physical fitness:  Strength, flexibility, and endurance.  Cardiovascular fitness.  Maintain a health body weight.  Avoid stress on the plantar fascia.  Wear properly fitted shoes, including arch supports for individuals who have flat feet. PROGNOSIS  If treated properly, then the symptoms of plantar fasciitis usually resolve without surgery. However, occasionally surgery is necessary. RELATED COMPLICATIONS   Recurrent symptoms that may result in a chronic condition.  Problems of the lower back that are caused by compensating for the injury, such as limping.  Pain or weakness of the foot during push-off following surgery.  Chronic  inflammation, scarring, and partial or complete fascia tear, occurring more often from repeated injections. TREATMENT  Treatment initially involves the use of ice and medication to help reduce pain and inflammation. The use of strengthening and stretching exercises may help reduce pain with activity, especially stretches of the Achilles tendon. These exercises may be performed at home or with a therapist. Your caregiver may recommend that you use heel cups of arch supports to help reduce stress on the plantar fascia. Occasionally, corticosteroid injections are given to reduce inflammation. If symptoms persist for greater than 6 months despite non-surgical (conservative), then surgery may be recommended.  MEDICATION   If pain medication is necessary, then nonsteroidal anti-inflammatory medications, such as aspirin and ibuprofen, or other minor pain relievers, such as acetaminophen, are often recommended.  Do not take pain medication within 7 days before surgery.  Prescription pain relievers may be given if deemed necessary by your caregiver. Use only as directed and only as much as you need.  Corticosteroid injections may be given by your caregiver. These injections should be reserved for the most serious cases, because they may only be given a certain number of times. HEAT AND COLD  Cold treatment (icing) relieves pain and reduces inflammation. Cold treatment should be applied for 10 to 15 minutes every 2 to 3 hours for inflammation and pain and immediately after any activity that aggravates your symptoms. Use ice packs or massage the area with a piece of ice (ice massage).  Heat treatment may be used prior to performing the stretching and strengthening activities prescribed by your caregiver, physical therapist, or athletic trainer. Use a heat pack or soak the  injury in warm water. SEEK IMMEDIATE MEDICAL CARE IF:  Treatment seems to offer no benefit, or the condition worsens.  Any medications  produce adverse side effects. EXERCISES RANGE OF MOTION (ROM) AND STRETCHING EXERCISES - Plantar Fasciitis (Heel Spur Syndrome) These exercises may help you when beginning to rehabilitate your injury. Your symptoms may resolve with or without further involvement from your physician, physical therapist or athletic trainer. While completing these exercises, remember:   Restoring tissue flexibility helps normal motion to return to the joints. This allows healthier, less painful movement and activity.  An effective stretch should be held for at least 30 seconds.  A stretch should never be painful. You should only feel a gentle lengthening or release in the stretched tissue. RANGE OF MOTION - Toe Extension, Flexion  Sit with your right / left leg crossed over your opposite knee.  Grasp your toes and gently pull them back toward the top of your foot. You should feel a stretch on the bottom of your toes and/or foot.  Hold this stretch for __________ seconds.  Now, gently pull your toes toward the bottom of your foot. You should feel a stretch on the top of your toes and or foot.  Hold this stretch for __________ seconds. Repeat __________ times. Complete this stretch __________ times per day.  RANGE OF MOTION - Ankle Dorsiflexion, Active Assisted  Remove shoes and sit on a chair that is preferably not on a carpeted surface.  Place right / left foot under knee. Extend your opposite leg for support.  Keeping your heel down, slide your right / left foot back toward the chair until you feel a stretch at your ankle or calf. If you do not feel a stretch, slide your bottom forward to the edge of the chair, while still keeping your heel down.  Hold this stretch for __________ seconds. Repeat __________ times. Complete this stretch __________ times per day.  STRETCH - Gastroc, Standing  Place hands on wall.  Extend right / left leg, keeping the front knee somewhat bent.  Slightly point your  toes inward on your back foot.  Keeping your right / left heel on the floor and your knee straight, shift your weight toward the wall, not allowing your back to arch.  You should feel a gentle stretch in the right / left calf. Hold this position for __________ seconds. Repeat __________ times. Complete this stretch __________ times per day. STRETCH - Soleus, Standing  Place hands on wall.  Extend right / left leg, keeping the other knee somewhat bent.  Slightly point your toes inward on your back foot.  Keep your right / left heel on the floor, bend your back knee, and slightly shift your weight over the back leg so that you feel a gentle stretch deep in your back calf.  Hold this position for __________ seconds. Repeat __________ times. Complete this stretch __________ times per day. STRETCH - Gastrocsoleus, Standing  Note: This exercise can place a lot of stress on your foot and ankle. Please complete this exercise only if specifically instructed by your caregiver.   Place the ball of your right / left foot on a step, keeping your other foot firmly on the same step.  Hold on to the wall or a rail for balance.  Slowly lift your other foot, allowing your body weight to press your heel down over the edge of the step.  You should feel a stretch in your right / left calf.  Hold  this position for __________ seconds.  Repeat this exercise with a slight bend in your right / left knee. Repeat __________ times. Complete this stretch __________ times per day.  STRENGTHENING EXERCISES - Plantar Fasciitis (Heel Spur Syndrome)  These exercises may help you when beginning to rehabilitate your injury. They may resolve your symptoms with or without further involvement from your physician, physical therapist or athletic trainer. While completing these exercises, remember:   Muscles can gain both the endurance and the strength needed for everyday activities through controlled  exercises.  Complete these exercises as instructed by your physician, physical therapist or athletic trainer. Progress the resistance and repetitions only as guided. STRENGTH - Towel Curls  Sit in a chair positioned on a non-carpeted surface.  Place your foot on a towel, keeping your heel on the floor.  Pull the towel toward your heel by only curling your toes. Keep your heel on the floor.  If instructed by your physician, physical therapist or athletic trainer, add ____________________ at the end of the towel. Repeat __________ times. Complete this exercise __________ times per day. STRENGTH - Ankle Inversion  Secure one end of a rubber exercise band/tubing to a fixed object (table, pole). Loop the other end around your foot just before your toes.  Place your fists between your knees. This will focus your strengthening at your ankle.  Slowly, pull your big toe up and in, making sure the band/tubing is positioned to resist the entire motion.  Hold this position for __________ seconds.  Have your muscles resist the band/tubing as it slowly pulls your foot back to the starting position. Repeat __________ times. Complete this exercises __________ times per day.    This information is not intended to replace advice given to you by your health care provider. Make sure you discuss any questions you have with your health care provider.   Document Released: 09/23/2005 Document Revised: 02/07/2015 Document Reviewed: 01/05/2009 Elsevier Interactive Patient Education [employer].

## 2016-02-23 NOTE — Progress Notes (Signed)
   Subjective:    Patient ID: Wendy Wagner, female    DOB: 10/03/1966, 50 y.o.   MRN: [id number]  HPI  50 year old female presents the office they for complaints of bilateral foot pain which is been ongoing for greater than 6 months. She states that she has pain mostly in the heels when she first gets up in the morning or after periods of rest. She peters he had orthotics made several years ago but she does not know if they're located. She denies any recent injury or trauma. No swelling or redness. The pain does not wake her up at night. She has had no recent treatment. No other complaints at this time.  Review of Systems  All other systems reviewed and are negative.      Objective:   Physical Exam General: AAO x3, NAD  Dermatological: Skin is warm, dry and supple bilateral. Nails x 10 are well manicured; remaining integument appears unremarkable at this time. There are no open sores, no preulcerative lesions, no rash or signs of infection present.  Vascular: Dorsalis Pedis artery and Posterior Tibial artery pedal pulses are 2/4 bilateral with immedate capillary fill time. Pedal hair growth present. No varicosities and no lower extremity edema present bilateral. There is no pain with calf compression, swelling, warmth, erythema.   Neruologic: Grossly intact via light touch bilateral. Vibratory intact via tuning fork bilateral. Protective threshold with Semmes Wienstein monofilament intact to all pedal sites bilateral. Patellar and Achilles deep tendon reflexes 2+ bilateral. No Babinski or clonus noted bilateral.   Musculoskeletal: Tenderness to palpation along the plantar medial tubercle of the calcaneus at the insertion of plantar fascia on the left and right foot. There is no pain along the course of the plantar fascia within the arch of the foot. Plantar fascia appears to be intact. There is no pain with lateral compression of the calcaneus or pain with vibratory sensation. There is no pain  along the course or insertion of the achilles tendon. No other areas of tenderness to bilateral lower extremities. Equinus is present. Flatfoot is evident.    Gait: Unassisted, Nonantalgic.     Assessment & Plan:  50 year old female presents for bilateral heel pain, plantar fasciitis. -Treatment options discussed including all alternatives, risks, and complications -Etiology of symptoms were discussed -X-rays were obtained and reviewed with the patient. No evidence of acute fracture or stress fracture. -Declined steroid injection -Prescribed medrol dose pack mobic. She is not interested together to start with a steroid once this point she can take meloxicam. She verbally understood this. Discussed side effects of the medication and directed to stop if any are to occur and call the office.  -Plantar fascial braces -I discussed custom orthotics for the patient and she wishes to proceed with them. She was scanned for orthotics they were sent to [facility]. -Stretching and icing daily. -Discussed shoe gear modifications. -Follow-up in 3 weeks or sooner if any problems arise. In the meantime, encouraged to call the office with any questions, concerns, change in symptoms.   Celesta Gentile, DPM

## 2016-03-15 ENCOUNTER — Ambulatory Visit (INDEPENDENT_AMBULATORY_CARE_PROVIDER_SITE_OTHER): Payer: BLUE CROSS/BLUE SHIELD | Admitting: Podiatry

## 2016-03-15 ENCOUNTER — Encounter: Payer: Self-pay | Admitting: Podiatry

## 2016-03-15 DIAGNOSIS — M722 Plantar fascial fibromatosis: Secondary | ICD-10-CM | POA: Diagnosis not present

## 2016-03-20 NOTE — Progress Notes (Signed)
Patient ID: Wendy Wagner, female   DOB: 01-Jan-1966, 50 y.o.   MRN: [id number]  Subjective: 50 year old female presents the office today to pick up orthotics and for follow-up evaluation of bilateral heel pain. She does that she is doing better and the brace is helping. She states after the steroid the swelling to completely resolve her started to come back some. She states the swelling she points to the inside aspect of her foot just below the ankle. Denies any systemic complaints such as fevers, chills, nausea, vomiting. No acute changes since last appointment, and no other complaints at this time.   Objective: AAO x3, NAD DP/PT pulses palpable bilaterally, CRT less than 3 seconds This continuation tenderness along the plantar medial tubercle of the calcaneus at the insertion of plantar fascia. There is no pain along the plantar fascial in the arch of the foot. There appears to be some slight edema on the medial aspect of the foot as well as overlying this area without any erythema or increase in warmth. Mild equinus present. No other areas of tenderness. No areas of pinpoint bony tenderness or pain with vibratory sensation. MMT 5/5, ROM WNL. No edema, erythema, increase in warmth to bilateral lower extremities.  No open lesions or pre-ulcerative lesions.  No pain with calf compression, swelling, warmth, erythema  Assessment: Bilateral heel pain, likely plantar fasciitis.  Plan: -All treatment options discussed with the patient including all alternatives, risks, complications.  -Orthotics were dispensed today. Oral and written break in instructions were discussed. -She wishes to hold off on steroid injection. -Continue anti-inflammatories as needed -Continue stretching, icing. -Follow-up in 4 weeks if symptoms continue or sooner if any problems arise. In the meantime, encouraged to call the office with any questions, concerns, change in symptoms.   Celesta Gentile, DPM

## 2016-04-19 ENCOUNTER — Ambulatory Visit: Payer: BLUE CROSS/BLUE SHIELD | Admitting: Podiatry

## 2016-05-29 ENCOUNTER — Encounter: Payer: Self-pay | Admitting: Internal Medicine

## 2016-05-29 ENCOUNTER — Ambulatory Visit (INDEPENDENT_AMBULATORY_CARE_PROVIDER_SITE_OTHER): Payer: BLUE CROSS/BLUE SHIELD | Admitting: Internal Medicine

## 2016-05-29 VITALS — BP 128/78 | HR 87 | Temp 98.5°F | Ht 66.0 in | Wt 330.0 lb

## 2016-05-29 DIAGNOSIS — Z0000 Encounter for general adult medical examination without abnormal findings: Secondary | ICD-10-CM

## 2016-05-29 DIAGNOSIS — Z1159 Encounter for screening for other viral diseases: Secondary | ICD-10-CM | POA: Diagnosis not present

## 2016-05-29 DIAGNOSIS — Z114 Encounter for screening for human immunodeficiency virus [HIV]: Secondary | ICD-10-CM

## 2016-05-29 DIAGNOSIS — K219 Gastro-esophageal reflux disease without esophagitis: Secondary | ICD-10-CM

## 2016-05-29 DIAGNOSIS — Z1239 Encounter for other screening for malignant neoplasm of breast: Secondary | ICD-10-CM | POA: Diagnosis not present

## 2016-05-29 DIAGNOSIS — M722 Plantar fascial fibromatosis: Secondary | ICD-10-CM

## 2016-05-29 LAB — COMPREHENSIVE METABOLIC PANEL
ALBUMIN: 4.1 g/dL (ref 3.5–5.2)
ALK PHOS: 75 U/L (ref 39–117)
ALT: 15 U/L (ref 0–35)
AST: 17 U/L (ref 0–37)
BUN: 12 mg/dL (ref 6–23)
CHLORIDE: 107 meq/L (ref 96–112)
CO2: 31 mEq/L (ref 19–32)
Calcium: 9.3 mg/dL (ref 8.4–10.5)
Creatinine, Ser: 0.94 mg/dL (ref 0.40–1.20)
GFR: 80.29 mL/min (ref >60.00)
Glucose, Bld: 95 mg/dL (ref 70–99)
POTASSIUM: 4.4 meq/L (ref 3.5–5.1)
SODIUM: 144 meq/L (ref 135–145)
TOTAL PROTEIN: 7.4 g/dL (ref 6.0–8.3)
Total Bilirubin: 0.4 mg/dL (ref 0.2–1.2)

## 2016-05-29 LAB — CBC
HEMATOCRIT: 37 % (ref 36.0–46.0)
HEMOGLOBIN: 12.1 g/dL (ref 12.0–15.0)
MCHC: 32.6 g/dL (ref 30.0–36.0)
MCV: 84.5 fl (ref 78.0–100.0)
Platelets: 331 10*3/uL (ref 150.0–400.0)
RBC: 4.39 Mil/uL (ref 3.87–5.11)
RDW: 15.5 % (ref 11.5–15.5)
WBC: 5.2 10*3/uL (ref 4.0–10.5)

## 2016-05-29 LAB — LIPID PANEL
CHOLESTEROL: 181 mg/dL (ref 0–200)
HDL: 69.9 mg/dL (ref >39.00)
LDL CALC: 92 mg/dL (ref 0–99)
NonHDL: 110.96
Total CHOL/HDL Ratio: 3
Triglycerides: 96 mg/dL (ref 0.0–149.0)
VLDL: 19.2 mg/dL (ref 0.0–40.0)

## 2016-05-29 LAB — HIV ANTIBODY (ROUTINE TESTING W REFLEX): HIV 1&2 Ab, 4th Generation: NONREACTIVE

## 2016-05-29 LAB — HEPATITIS C ANTIBODY: HCV Ab: NEGATIVE

## 2016-05-29 LAB — HEMOGLOBIN A1C: HEMOGLOBIN A1C: 6.2 % (ref 4.6–6.5)

## 2016-05-29 NOTE — Assessment & Plan Note (Signed)
Discussed how weight loss could help improve her reflux Avoid triggers Continue Tums or Rolaids as needed

## 2016-05-29 NOTE — Progress Notes (Signed)
Subjective:    Patient ID: Wendy Wagner, female    DOB: 08-14-1966, 50 y.o.   MRN: [id number]  HPI  Pt presents to the clinic today for her annual exam.  Flu: never Tetanus: < 10 years ago Pap Smear: 02/2015 Mammogram: ordered 02/2015, but she did not schedule Colon Screening: never Vision Screening: yearly Dentist: no, she has dentures  Diet: She does eat meat. She consumes more veggies than fruits. She does eat some fried foods. She drinks mostly water and grapefruits juice. Exercise: None  She is concerned about her weight. This is the heaviest she has ever been at 330 lbs, BMI of 53.26. She is on a pre bariatric surgery diet that she got from a fried.  Review of Systems      Past Medical History:  Diagnosis Date  . Childhood asthma   . GERD (gastroesophageal reflux disease)   . Heart murmur     Current Outpatient Prescriptions  Medication Sig Dispense Refill  . Aspirin-Acetaminophen (GOODYS BODY PAIN PO) Take by mouth as needed. Reported on 02/23/2016    . Hydrocodone-Homatropine 5-1.5 MG TABS Take 1 tablet by mouth every 8 (eight) hours as needed. (Patient not taking: Reported on 02/13/2016) 56 each 0  . meloxicam (MOBIC) 15 MG tablet Take 1 tablet (15 mg total) by mouth daily. 30 tablet 2  . methocarbamol (ROBAXIN) 500 MG tablet Take 1 tablet (500 mg total) by mouth every 8 (eight) hours as needed for muscle spasms. (Patient not taking: Reported on 02/23/2016) 30 tablet 0  . methylPREDNISolone (MEDROL DOSEPAK) 4 MG TBPK tablet Take as directed 21 tablet 0   No current facility-administered medications for this visit.     Allergies  Allergen Reactions  . No Known Drug Allergy     Family History  Problem Relation Age of Onset  . Cancer Mother     brain  . Cancer Father     brain, lung  . Diabetes Neg Hx   . Heart disease Neg Hx   . Stroke Neg Hx     Social History   Social History  . Marital status: Single    Spouse name: N/A  . Number of children: N/A   . Years of education: N/A   Occupational History  . Not on file.   Social History Main Topics  . Smoking status: Never Smoker  . Smokeless tobacco: Never Used  . Alcohol use No  . Drug use: No  . Sexual activity: Not Currently   Other Topics Concern  . Not on file   Social History Narrative  . No narrative on file     Constitutional: Pt reports weight gain. Denies fever, malaise, fatigue, headache.  HEENT: Denies eye pain, eye redness, ear pain, ringing in the ears, wax buildup, runny nose, nasal congestion, bloody nose, or sore throat. Respiratory: Denies difficulty breathing, shortness of breath, cough or sputum production.   Cardiovascular: Pt reports swelling in her legs. Denies chest pain, chest tightness, palpitations or swelling in the handst.  Gastrointestinal: Pt reports reflux. Denies abdominal pain, bloating, constipation, diarrhea or blood in the stool.  GU: Denies urgency, frequency, pain with urination, burning sensation, blood in urine, odor or discharge. Musculoskeletal: Pt reports bilateral feet pain. Denies decrease in range of motion, difficulty with gait, muscle pain or joint pain and swelling.  Skin: Denies redness, rashes, lesions or ulcercations.  Neurological: Denies dizziness, difficulty with memory, difficulty with speech or problems with balance and coordination.  Psych: Denies anxiety,  depression, SI/HI.  No other specific complaints in a complete review of systems (except as listed in HPI above).  Objective:   Physical Exam   BP 128/78   Pulse 87   Temp 98.5 F (36.9 C) (Oral)   Ht 5\' 6"  (1.676 m)   Wt (!) 330 lb (149.7 kg)   LMP 05/22/2016   SpO2 98%   BMI 53.26 kg/m   Wt Readings from Last 3 Encounters:  02/13/16 (!) 329 lb 8 oz (149.5 kg)  09/25/15 (!) 318 lb (144.2 kg)  08/18/15 (!) 311 lb (141.1 kg)    General: Appears her stated age, obese in NAD. Skin: Warm, dry and intact.  HEENT: Head: normal shape and size; Eyes: sclera  white, no icterus, conjunctiva pink, PERRLA and EOMs intact; Ears: Tm's gray and intact, normal light reflex; Throat/Mouth: Teeth present, mucosa pink and moist, no exudate, lesions or ulcerations noted.  Neck: Neck supple, trachea midline. No masses, lumps or thyromegaly present.  Cardiovascular: Normal rate and rhythm. S1,S2 noted.  Murmur noted. No rubs or gallops noted. 1+ BLE edema. No carotid bruits noted. Pulmonary/Chest: Normal effort and positive vesicular breath sounds. No respiratory distress. No wheezes, rales or ronchi noted.  Abdomen: Soft and nontender. Normal bowel sounds. No distention or masses noted. Liver, spleen and kidneys non palpable. Musculoskeletal: Strength 5/5 BUE/BLE. No signs of joint swelling. Gait slow but steady. Neurological: Alert and oriented. Cranial nerves II-XII grossly intact. Coordination normal. Psychiatric: Mood and affect normal. Behavior is normal. Judgment and thought content normal.       Assessment & Plan:   Preventative Health Maintenance:  Encouraged her to work on diet and exercise She declines flu shot Tetanus UTD Pap smear UTD Will order Mammogram, she will call [facility] to schedule Will check CBC, CMET, Lipid, A1C, HIV and Hep C today She declines colonoscopy, cologuard or IFOB  RTC in 1 year or sooner if needed Webb Silversmith, NP

## 2016-05-29 NOTE — Assessment & Plan Note (Signed)
She is requesting something for the pain Discussed restarting her Meloxicam daily

## 2016-05-29 NOTE — Progress Notes (Signed)
Pre visit review using our clinic review tool, if applicable. No additional management support is needed unless otherwise documented below in the visit note.   Flu--never.Marland KitchenMarland KitchenTD--<10years.Marland KitchenMarland KitchenPap--2011?Marland KitchenMarland KitchenMarland KitchenMamm--2013?Marland KitchenMarland KitchenMarland KitchenMarland KitchenColon--never.Marland KitchenMarland KitchenMarland KitchenVision--yearly.Marland KitchenMarland KitchenDentist--Dentures

## 2016-05-29 NOTE — Patient Instructions (Signed)
Health Maintenance, Female Adopting a healthy lifestyle and getting preventive care can go a long way to promote health and wellness. Talk with your health care provider about what schedule of regular examinations is right for you. This is a good chance for you to check in with your provider about disease prevention and staying healthy. In between checkups, there are plenty of things you can do on your own. Experts have done a lot of research about which lifestyle changes and preventive measures are most likely to keep you healthy. Ask your health care provider for more information. WEIGHT AND DIET  Eat a healthy diet  Be sure to include plenty of vegetables, fruits, low-fat dairy products, and lean protein.  Do not eat a lot of foods high in solid fats, added sugars, or salt.  Get regular exercise. This is one of the most important things you can do for your health.  Most adults should exercise for at least 150 minutes each week. The exercise should increase your heart rate and make you sweat (moderate-intensity exercise).  Most adults should also do strengthening exercises at least twice a week. This is in addition to the moderate-intensity exercise.  Maintain a healthy weight  Body mass index (BMI) is a measurement that can be used to identify possible weight problems. It estimates body fat based on height and weight. Your health care provider can help determine your BMI and help you achieve or maintain a healthy weight.  For females 20 years of age and older:   A BMI below 18.5 is considered underweight.  A BMI of 18.5 to 24.9 is normal.  A BMI of 25 to 29.9 is considered overweight.  A BMI of 30 and above is considered obese.  Watch levels of cholesterol and blood lipids  You should start having your blood tested for lipids and cholesterol at 52 years of age, then have this test every 5 years.  You may need to have your cholesterol levels checked more often if:  Your lipid  or cholesterol levels are high.  You are older than 52 years of age.  You are at high risk for heart disease.  CANCER SCREENING   Lung Cancer  Lung cancer screening is recommended for adults 55-80 years old who are at high risk for lung cancer because of a history of smoking.  A yearly low-dose CT scan of the lungs is recommended for people who:  Currently smoke.  Have quit within the past 15 years.  Have at least a 30-pack-year history of smoking. A pack year is smoking an average of one pack of cigarettes a day for 1 year.  Yearly screening should continue until it has been 15 years since you quit.  Yearly screening should stop if you develop a health problem that would prevent you from having lung cancer treatment.  Breast Cancer  Practice breast self-awareness. This means understanding how your breasts normally appear and feel.  It also means doing regular breast self-exams. Let your health care provider know about any changes, no matter how small.  If you are in your 20s or 30s, you should have a clinical breast exam (CBE) by a health care provider every 1-3 years as part of a regular health exam.  If you are 40 or older, have a CBE every year. Also consider having a breast X-ray (mammogram) every year.  If you have a family history of breast cancer, talk to your health care provider about genetic screening.  If you   are at high risk for breast cancer, talk to your health care provider about having an MRI and a mammogram every year.  Breast cancer gene (BRCA) assessment is recommended for women who have family members with BRCA-related cancers. BRCA-related cancers include:  Breast.  Ovarian.  Tubal.  Peritoneal cancers.  Results of the assessment will determine the need for genetic counseling and BRCA1 and BRCA2 testing. Cervical Cancer Your health care provider may recommend that you be screened regularly for cancer of the pelvic organs (ovaries, uterus, and  vagina). This screening involves a pelvic examination, including checking for microscopic changes to the surface of your cervix (Pap test). You may be encouraged to have this screening done every 3 years, beginning at age 21.  For women ages 30-65, health care providers may recommend pelvic exams and Pap testing every 3 years, or they may recommend the Pap and pelvic exam, combined with testing for human papilloma virus (HPV), every 5 years. Some types of HPV increase your risk of cervical cancer. Testing for HPV may also be done on women of any age with unclear Pap test results.  Other health care providers may not recommend any screening for nonpregnant women who are considered low risk for pelvic cancer and who do not have symptoms. Ask your health care provider if a screening pelvic exam is right for you.  If you have had past treatment for cervical cancer or a condition that could lead to cancer, you need Pap tests and screening for cancer for at least 20 years after your treatment. If Pap tests have been discontinued, your risk factors (such as having a new sexual partner) need to be reassessed to determine if screening should resume. Some women have medical problems that increase the chance of getting cervical cancer. In these cases, your health care provider may recommend more frequent screening and Pap tests. Colorectal Cancer  This type of cancer can be detected and often prevented.  Routine colorectal cancer screening usually begins at 52 years of age and continues through 52 years of age.  Your health care provider may recommend screening at an earlier age if you have risk factors for colon cancer.  Your health care provider may also recommend using home test kits to check for hidden blood in the stool.  A small camera at the end of a tube can be used to examine your colon directly (sigmoidoscopy or colonoscopy). This is done to check for the earliest forms of colorectal  cancer.  Routine screening usually begins at age 50.  Direct examination of the colon should be repeated every 5-10 years through 52 years of age. However, you may need to be screened more often if early forms of precancerous polyps or small growths are found. Skin Cancer  Check your skin from head to toe regularly.  Tell your health care provider about any new moles or changes in moles, especially if there is a change in a mole's shape or color.  Also tell your health care provider if you have a mole that is larger than the size of a pencil eraser.  Always use sunscreen. Apply sunscreen liberally and repeatedly throughout the day.  Protect yourself by wearing long sleeves, pants, a wide-brimmed hat, and sunglasses whenever you are outside. HEART DISEASE, DIABETES, AND HIGH BLOOD PRESSURE   High blood pressure causes heart disease and increases the risk of stroke. High blood pressure is more likely to develop in:  People who have blood pressure in the high end   of the normal range (130-139/85-89 mm Hg).  People who are overweight or obese.  People who are African American.  If you are 38-23 years of age, have your blood pressure checked every 3-5 years. If you are 61 years of age or older, have your blood pressure checked every year. You should have your blood pressure measured twice--once when you are at a hospital or clinic, and once when you are not at a hospital or clinic. Record the average of the two measurements. To check your blood pressure when you are not at a hospital or clinic, you can use:  An automated blood pressure machine at a pharmacy.  A home blood pressure monitor.  If you are between 45 years and 39 years old, ask your health care provider if you should take aspirin to prevent strokes.  Have regular diabetes screenings. This involves taking a blood sample to check your fasting blood sugar level.  If you are at a normal weight and have a low risk for diabetes,  have this test once every three years after 52 years of age.  If you are overweight and have a high risk for diabetes, consider being tested at a younger age or more often. PREVENTING INFECTION  Hepatitis B  If you have a higher risk for hepatitis B, you should be screened for this virus. You are considered at high risk for hepatitis B if:  You were born in a country where hepatitis B is common. Ask your health care provider which countries are considered high risk.  Your parents were born in a high-risk country, and you have not been immunized against hepatitis B (hepatitis B vaccine).  You have HIV or AIDS.  You use needles to inject street drugs.  You live with someone who has hepatitis B.  You have had sex with someone who has hepatitis B.  You get hemodialysis treatment.  You take certain medicines for conditions, including cancer, organ transplantation, and autoimmune conditions. Hepatitis C  Blood testing is recommended for:  Everyone born from [birth year] through 1967.  Anyone with known risk factors for hepatitis C. Sexually transmitted infections (STIs)  You should be screened for sexually transmitted infections (STIs) including gonorrhea and chlamydia if:  You are sexually active and are younger than 52 years of age.  You are older than 53 years of age and your health care provider tells you that you are at risk for this type of infection.  Your sexual activity has changed since you were last screened and you are at an increased risk for chlamydia or gonorrhea. Ask your health care provider if you are at risk.  If you do not have HIV, but are at risk, it may be recommended that you take a prescription medicine daily to prevent HIV infection. This is called pre-exposure prophylaxis (PrEP). You are considered at risk if:  You are sexually active and do not regularly use condoms or know the HIV status of your partner(s).  You take drugs by injection.  You are sexually  active with a partner who has HIV. Talk with your health care provider about whether you are at high risk of being infected with HIV. If you choose to begin PrEP, you should first be tested for HIV. You should then be tested every 3 months for as long as you are taking PrEP.  PREGNANCY   If you are premenopausal and you may become pregnant, ask your health care provider about preconception counseling.  If you may  become pregnant, take 400 to 800 micrograms (mcg) of folic acid every day.  If you want to prevent pregnancy, talk to your health care provider about birth control (contraception). OSTEOPOROSIS AND MENOPAUSE   Osteoporosis is a disease in which the bones lose minerals and strength with aging. This can result in serious bone fractures. Your risk for osteoporosis can be identified using a bone density scan.  If you are 61 years of age or older, or if you are at risk for osteoporosis and fractures, ask your health care provider if you should be screened.  Ask your health care provider whether you should take a calcium or vitamin D supplement to lower your risk for osteoporosis.  Menopause may have certain physical symptoms and risks.  Hormone replacement therapy may reduce some of these symptoms and risks. Talk to your health care provider about whether hormone replacement therapy is right for you.  HOME CARE INSTRUCTIONS   Schedule regular health, dental, and eye exams.  Stay current with your immunizations.   Do not use any tobacco products including cigarettes, chewing tobacco, or electronic cigarettes.  If you are pregnant, do not drink alcohol.  If you are breastfeeding, limit how much and how often you drink alcohol.  Limit alcohol intake to no more than 1 drink per day for nonpregnant women. One drink equals 12 ounces of beer, 5 ounces of wine, or 1 ounces of hard liquor.  Do not use street drugs.  Do not share needles.  Ask your health care provider for help if  you need support or information about quitting drugs.  Tell your health care provider if you often feel depressed.  Tell your health care provider if you have ever been abused or do not feel safe at home.   This information is not intended to replace advice given to you by your health care provider. Make sure you discuss any questions you have with your health care provider.   Document Released: 04/08/2011 Document Revised: 10/14/2014 Document Reviewed: 08/25/2013 Elsevier Interactive Patient Education [employer].

## 2016-06-28 ENCOUNTER — Other Ambulatory Visit: Payer: Self-pay | Admitting: Internal Medicine

## 2016-10-10 ENCOUNTER — Ambulatory Visit: Payer: BLUE CROSS/BLUE SHIELD | Admitting: Internal Medicine

## 2016-10-10 ENCOUNTER — Encounter: Payer: Self-pay | Admitting: Internal Medicine

## 2016-10-10 ENCOUNTER — Ambulatory Visit (INDEPENDENT_AMBULATORY_CARE_PROVIDER_SITE_OTHER): Payer: BLUE CROSS/BLUE SHIELD | Admitting: Internal Medicine

## 2016-10-10 VITALS — BP 120/74 | HR 76 | Temp 100.0°F | Wt 325.0 lb

## 2016-10-10 DIAGNOSIS — J069 Acute upper respiratory infection, unspecified: Secondary | ICD-10-CM

## 2016-10-10 MED ORDER — AZITHROMYCIN 250 MG PO TABS: ORAL_TABLET | ORAL | 0 refills | Status: DC

## 2016-10-10 MED ORDER — HYDROCODONE-HOMATROPINE 5-1.5 MG PO TABS: 1.0000 | ORAL_TABLET | Freq: Three times a day (TID) | ORAL | 0 refills | Status: DC | PRN

## 2016-10-10 NOTE — Progress Notes (Signed)
HPI  Pt presents to the clinic today with c/o headache, runny nose and cough. This started 2 weeks ago. She describes the headache as pressure. She is blowing clear mucous out of her nose. The cough is productive of green/brown, blood tinged mucous. She has run fevers up to 101, had chills but not body aches. She has tried [employer] cold and flu and BC powders with minimal relief. She has a history of childhood asthma but it has not affected her as an adult. She has had sick contacts.  Review of Systems        Past Medical History:  Diagnosis Date  . Childhood asthma   . GERD (gastroesophageal reflux disease)   . Heart murmur     Family History  Problem Relation Age of Onset  . Cancer Mother     brain  . Cancer Father     brain, lung  . Diabetes Neg Hx   . Heart disease Neg Hx   . Stroke Neg Hx     Social History   Social History  . Marital status: Single    Spouse name: N/A  . Number of children: N/A  . Years of education: N/A   Occupational History  . Not on file.   Social History Main Topics  . Smoking status: Never Smoker  . Smokeless tobacco: Never Used  . Alcohol use No  . Drug use: No  . Sexual activity: Not Currently   Other Topics Concern  . Not on file   Social History Narrative  . No narrative on file    Allergies  Allergen Reactions  . No Known Drug Allergy      Constitutional: Positive headache, fatigue and fever. Denies abrupt weight changes.  HEENT:  Positive runny nose. Denies eye redness, eye pain, pressure behind the eyes, facial pain, nasal congestion, ear pain, ringing in the ears, wax buildup, or sore throat. Respiratory: Positive cough. Denies difficulty breathing or shortness of breath.  Cardiovascular: Denies chest pain, chest tightness, palpitations or swelling in the hands or feet.   No other specific complaints in a complete review of systems (except as listed in HPI above).  Objective:   BP 120/74   Pulse 76   Temp 100  F (37.8 C) (Oral)   Wt (!) 325 lb (147.4 kg)   SpO2 97%   BMI 52.46 kg/m  Wt Readings from Last 3 Encounters:  10/10/16 (!) 325 lb (147.4 kg)  05/29/16 (!) 330 lb (149.7 kg)  02/13/16 (!) 329 lb 8 oz (149.5 kg)     General: Appears her stated age, obese in NAD. HEENT: Head: normal shape and size, no sinus tenderness; Eyes: sclera white, no icterus, conjunctiva pink; Ears: Tm's gray and intact, normal light reflex, + serous effusion bilaterally; Throat/Mouth: Teeth present, mucosa pink and moist, no exudate noted, no lesions or ulcerations noted.  Neck: No cervical lymphadenopathy.  Cardiovascular: Normal rate and rhythm. S1,S2 noted.  No murmur, rubs or gallops noted.  Pulmonary/Chest: Normal effort and coarse vesicular breath sounds. No respiratory distress. No wheezes, rales or ronchi noted.       Assessment & Plan:   Upper Respiratory Infection:  Get some rest and drink plenty of water Ibuprofen or Tylenol for fever eRx for Azithromax x 5 days eRx for Hycodan cough tabs  RTC as needed or if symptoms persist.   Webb Silversmith, NP

## 2016-10-10 NOTE — Patient Instructions (Signed)
Upper Respiratory Infection, Adult Most upper respiratory infections (URIs) are caused by a virus. A URI affects the nose, throat, and upper air passages. The most common type of URI is often called "the common cold." Follow these instructions at home:  Take medicines only as told by your doctor.  Gargle warm saltwater or take cough drops to comfort your throat as told by your doctor.  Use a warm mist humidifier or inhale steam from a shower to increase air moisture. This may make it easier to breathe.  Drink enough fluid to keep your pee (urine) clear or pale yellow.  Eat soups and other clear broths.  Have a healthy diet.  Rest as needed.  Go back to work when your fever is gone or your doctor says it is okay.  You may need to stay home longer to avoid giving your URI to others.  You can also wear a face mask and wash your hands often to prevent spread of the virus.  Use your inhaler more if you have asthma.  Do not use any tobacco products, including cigarettes, chewing tobacco, or electronic cigarettes. If you need help quitting, ask your doctor. Contact a doctor if:  You are getting worse, not better.  Your symptoms are not helped by medicine.  You have chills.  You are getting more short of breath.  You have brown or red mucus.  You have yellow or brown discharge from your nose.  You have pain in your face, especially when you bend forward.  You have a fever.  You have puffy (swollen) neck glands.  You have pain while swallowing.  You have white areas in the back of your throat. Get help right away if:  You have very bad or constant:  Headache.  Ear pain.  Pain in your forehead, behind your eyes, and over your cheekbones (sinus pain).  Chest pain.  You have long-lasting (chronic) lung disease and any of the following:  Wheezing.  Long-lasting cough.  Coughing up blood.  A change in your usual mucus.  You have a stiff neck.  You have  changes in your:  Vision.  Hearing.  Thinking.  Mood. This information is not intended to replace advice given to you by your health care provider. Make sure you discuss any questions you have with your health care provider. Document Released: 03/11/2008 Document Revised: 05/26/2016 Document Reviewed: 12/29/2013 Elsevier Interactive Patient Education  2017 Elsevier Inc.  

## 2017-01-09 ENCOUNTER — Encounter: Payer: Self-pay | Admitting: Internal Medicine

## 2017-01-09 ENCOUNTER — Ambulatory Visit (INDEPENDENT_AMBULATORY_CARE_PROVIDER_SITE_OTHER): Payer: BLUE CROSS/BLUE SHIELD | Admitting: Internal Medicine

## 2017-01-09 VITALS — BP 126/70 | HR 74 | Temp 98.2°F | Wt 341.0 lb

## 2017-01-09 DIAGNOSIS — L298 Other pruritus: Secondary | ICD-10-CM | POA: Diagnosis not present

## 2017-01-09 DIAGNOSIS — N898 Other specified noninflammatory disorders of vagina: Secondary | ICD-10-CM

## 2017-01-09 DIAGNOSIS — R05 Cough: Secondary | ICD-10-CM | POA: Diagnosis not present

## 2017-01-09 DIAGNOSIS — R059 Cough, unspecified: Secondary | ICD-10-CM

## 2017-01-09 MED ORDER — HYDROCODONE-HOMATROPINE 5-1.5 MG PO TABS: 1.0000 | ORAL_TABLET | Freq: Three times a day (TID) | ORAL | 0 refills | Status: DC | PRN

## 2017-01-09 MED ORDER — FLUCONAZOLE 150 MG PO TABS: 150.0000 mg | ORAL_TABLET | Freq: Once | ORAL | 0 refills | Status: AC

## 2017-01-09 NOTE — Patient Instructions (Signed)
Cough, Adult  A cough helps to clear your throat and lungs. A cough may last only 2?3 weeks (acute), or it may last longer than 8 weeks (chronic). Many different things can cause a cough. A cough may be a sign of an illness or another medical condition.  Follow these instructions at home:  ? Pay attention to any changes in your cough.  ? Take medicines only as told by your doctor.  ? If you were prescribed an antibiotic medicine, take it as told by your doctor. Do not stop taking it even if you start to feel better.  ? Talk with your doctor before you try using a cough medicine.  ? Drink enough fluid to keep your pee (urine) clear or pale yellow.  ? If the air is dry, use a cold steam vaporizer or humidifier in your home.  ? Stay away from things that make you cough at work or at home.  ? If your cough is worse at night, try using extra pillows to raise your head up higher while you sleep.  ? Do not smoke, and try not to be around smoke. If you need help quitting, ask your doctor.  ? Do not have caffeine.  ? Do not drink alcohol.  ? Rest as needed.  Contact a doctor if:  ? You have new problems (symptoms).  ? You cough up yellow fluid (pus).  ? Your cough does not get better after 2?3 weeks, or your cough gets worse.  ? Medicine does not help your cough and you are not sleeping well.  ? You have pain that gets worse or pain that is not helped with medicine.  ? You have a fever.  ? You are losing weight and you do not know why.  ? You have night sweats.  Get help right away if:  ? You cough up blood.  ? You have trouble breathing.  ? Your heartbeat is very fast.  This information is not intended to replace advice given to you by your health care provider. Make sure you discuss any questions you have with your health care provider.  Document Released: 06/06/2011 Document Revised: 02/29/2016 Document Reviewed: 11/30/2014  Elsevier Interactive Patient Education ? 2017 Elsevier Inc.

## 2017-01-09 NOTE — Progress Notes (Signed)
Pre visit review using our clinic review tool, if applicable. No additional management support is needed unless otherwise documented below in the visit note. 

## 2017-01-09 NOTE — Progress Notes (Signed)
HPI  Pt presents to the clinic today with c/o cough. This started 1 week ago. The cough is nonproductive. She denies runny nose, nasal congestion, ear pain or sore throat. She denies fever, chills or body aches. She has tried Robitussin OTC with minimal relief. She has no history of seasonal allergies. She has had sick contacts.  As I was walking out the door, she mentions vaginal itching. She denies discharge, odor or bleeding. She asked to have a pap smear today. She had not tried anything OTC.  Review of Systems        Past Medical History:  Diagnosis Date  . Childhood asthma   . GERD (gastroesophageal reflux disease)   . Heart murmur     Family History  Problem Relation Age of Onset  . Cancer Mother     brain  . Cancer Father     brain, lung  . Diabetes Neg Hx   . Heart disease Neg Hx   . Stroke Neg Hx     Social History   Social History  . Marital status: Single    Spouse name: N/A  . Number of children: N/A  . Years of education: N/A   Occupational History  . Not on file.   Social History Main Topics  . Smoking status: Never Smoker  . Smokeless tobacco: Never Used  . Alcohol use No  . Drug use: No  . Sexual activity: Not Currently   Other Topics Concern  . Not on file   Social History Narrative  . No narrative on file    Allergies  Allergen Reactions  . No Known Drug Allergy      Constitutional: Denies headache, fatigue, fever or abrupt weight changes.  HEENT: Denies eye redness, eye pain, pressure behind the eyes, facial pain, nasal congestion, ear pain, ringing in the ears, wax buildup, runny nose or sore throat. Respiratory: Positive cough. Denies difficulty breathing or shortness of breath.  Cardiovascular: Denies chest pain, chest tightness, palpitations or swelling in the hands or feet.  GU: Positive vaginal itching. Denies urgency, frequency, dysuria, discharge or odor.  No other specific complaints in a complete review of systems (except  as listed in HPI above).  Objective:   BP 126/70   Pulse 74   Temp 98.2 F (36.8 C) (Oral)   Wt (!) 341 lb (154.7 kg)   SpO2 97%   BMI 55.04 kg/m  Wt Readings from Last 3 Encounters:  01/09/17 (!) 341 lb (154.7 kg)  10/10/16 (!) 325 lb (147.4 kg)  05/29/16 (!) 330 lb (149.7 kg)     General: Appears her stated age, obese in NAD. HEENT: Head: normal shape and size, no sinus tenderness noted; Throat/Mouth: Teeth present, mucosa pink and moist, no exudate noted, no lesions or ulcerations noted.  Neck: No cervical lymphadenopathy.  Cardiovascular: Normal rate and rhythm. S1,S2 noted.  Murmur noted.  Pulmonary/Chest: Normal effort and positive vesicular breath sounds. No respiratory distress. No wheezes, rales or ronchi noted.       Assessment & Plan:   Cough:  Likely viral Get some rest and drink plenty of water Do salt water gargles for the sore throat Rx for Hycodan cough tablets  Vaginal Itching:  I did not have time for a pelvic exam today eRx for Diflucan 150 mg PO x 1  RTC as needed or if symptoms persist.   Webb Silversmith, NP

## 2017-05-30 ENCOUNTER — Ambulatory Visit (INDEPENDENT_AMBULATORY_CARE_PROVIDER_SITE_OTHER): Payer: BLUE CROSS/BLUE SHIELD | Admitting: Internal Medicine

## 2017-05-30 ENCOUNTER — Encounter: Payer: Self-pay | Admitting: Internal Medicine

## 2017-05-30 ENCOUNTER — Other Ambulatory Visit (HOSPITAL_COMMUNITY)
Admission: RE | Admit: 2017-05-30 | Discharge: 2017-05-30 | Disposition: A | Discharge status: Home / Self Care | Payer: BLUE CROSS/BLUE SHIELD | Source: Ambulatory Visit | Attending: Internal Medicine | Admitting: Internal Medicine

## 2017-05-30 VITALS — BP 122/80 | HR 65 | Temp 98.2°F | Ht 66.0 in | Wt 338.0 lb

## 2017-05-30 DIAGNOSIS — B9689 Other specified bacterial agents as the cause of diseases classified elsewhere: Secondary | ICD-10-CM | POA: Insufficient documentation

## 2017-05-30 DIAGNOSIS — Z113 Encounter for screening for infections with a predominantly sexual mode of transmission: Secondary | ICD-10-CM | POA: Diagnosis not present

## 2017-05-30 DIAGNOSIS — Z124 Encounter for screening for malignant neoplasm of cervix: Secondary | ICD-10-CM | POA: Insufficient documentation

## 2017-05-30 DIAGNOSIS — Z0000 Encounter for general adult medical examination without abnormal findings: Secondary | ICD-10-CM

## 2017-05-30 LAB — LIPID PANEL
Cholesterol: 184 mg/dL (ref 0–200)
HDL: 60.5 mg/dL (ref >39.00)
LDL Cholesterol: 99 mg/dL (ref 0–99)
NonHDL: 123.33
TRIGLYCERIDES: 124 mg/dL (ref 0.0–149.0)
Total CHOL/HDL Ratio: 3
VLDL: 24.8 mg/dL (ref 0.0–40.0)

## 2017-05-30 LAB — COMPREHENSIVE METABOLIC PANEL
ALT: 15 U/L (ref 0–35)
AST: 17 U/L (ref 0–37)
Albumin: 4 g/dL (ref 3.5–5.2)
Alkaline Phosphatase: 68 U/L (ref 39–117)
BUN: 11 mg/dL (ref 6–23)
CALCIUM: 9.3 mg/dL (ref 8.4–10.5)
CO2: 30 meq/L (ref 19–32)
CREATININE: 0.93 mg/dL (ref 0.40–1.20)
Chloride: 105 mEq/L (ref 96–112)
GFR: 80.97 mL/min (ref >60.00)
Glucose, Bld: 90 mg/dL (ref 70–99)
Potassium: 4.3 mEq/L (ref 3.5–5.1)
SODIUM: 141 meq/L (ref 135–145)
Total Bilirubin: 0.3 mg/dL (ref 0.2–1.2)
Total Protein: 7.2 g/dL (ref 6.0–8.3)

## 2017-05-30 LAB — CBC
HCT: 38.5 % (ref 36.0–46.0)
Hemoglobin: 12.3 g/dL (ref 12.0–15.0)
MCHC: 31.8 g/dL (ref 30.0–36.0)
MCV: 87.6 fl (ref 78.0–100.0)
Platelets: 310 10*3/uL (ref 150.0–400.0)
RBC: 4.4 Mil/uL (ref 3.87–5.11)
RDW: 14.7 % (ref 11.5–15.5)
WBC: 4.8 10*3/uL (ref 4.0–10.5)

## 2017-05-30 LAB — HEMOGLOBIN A1C: Hgb A1c MFr Bld: 6.2 % (ref 4.6–6.5)

## 2017-05-30 NOTE — Patient Instructions (Signed)
Health Maintenance for Postmenopausal Women Menopause is a normal process in which your reproductive ability comes to an end. This process happens gradually over a span of months to years, usually between the ages of 22 and 9. Menopause is complete when you have missed 12 consecutive menstrual periods. It is important to talk with your health care provider about some of the most common conditions that affect postmenopausal women, such as heart disease, cancer, and bone loss (osteoporosis). Adopting a healthy lifestyle and getting preventive care can help to promote your health and wellness. Those actions can also lower your chances of developing some of these common conditions. What should I know about menopause? During menopause, you may experience a number of symptoms, such as:  Moderate-to-severe hot flashes.  Night sweats.  Decrease in sex drive.  Mood swings.  Headaches.  Tiredness.  Irritability.  Memory problems.  Insomnia.  Choosing to treat or not to treat menopausal changes is an individual decision that you make with your health care provider. What should I know about hormone replacement therapy and supplements? Hormone therapy products are effective for treating symptoms that are associated with menopause, such as hot flashes and night sweats. Hormone replacement carries certain risks, especially as you become older. If you are thinking about using estrogen or estrogen with progestin treatments, discuss the benefits and risks with your health care provider. What should I know about heart disease and stroke? Heart disease, heart attack, and stroke become more likely as you age. This may be due, in part, to the hormonal changes that your body experiences during menopause. These can affect how your body processes dietary fats, triglycerides, and cholesterol. Heart attack and stroke are both medical emergencies. There are many things that you can do to help prevent heart disease  and stroke:  Have your blood pressure checked at least every 1-2 years. High blood pressure causes heart disease and increases the risk of stroke.  If you are 53-22 years old, ask your health care provider if you should take aspirin to prevent a heart attack or a stroke.  Do not use any tobacco products, including cigarettes, chewing tobacco, or electronic cigarettes. If you need help quitting, ask your health care provider.  It is important to eat a healthy diet and maintain a healthy weight. ? Be sure to include plenty of vegetables, fruits, low-fat dairy products, and lean protein. ? Avoid eating foods that are high in solid fats, added sugars, or salt (sodium).  Get regular exercise. This is one of the most important things that you can do for your health. ? Try to exercise for at least 150 minutes each week. The type of exercise that you do should increase your heart rate and make you sweat. This is known as moderate-intensity exercise. ? Try to do strengthening exercises at least twice each week. Do these in addition to the moderate-intensity exercise.  Know your numbers.Ask your health care provider to check your cholesterol and your blood glucose. Continue to have your blood tested as directed by your health care provider.  What should I know about cancer screening? There are several types of cancer. Take the following steps to reduce your risk and to catch any cancer development as early as possible. Breast Cancer  Practice breast self-awareness. ? This means understanding how your breasts normally appear and feel. ? It also means doing regular breast self-exams. Let your health care provider know about any changes, no matter how small.  If you are 40  or older, have a clinician do a breast exam (clinical breast exam or CBE) every year. Depending on your age, family history, and medical history, it may be recommended that you also have a yearly breast X-ray (mammogram).  If you  have a family history of breast cancer, talk with your health care provider about genetic screening.  If you are at high risk for breast cancer, talk with your health care provider about having an MRI and a mammogram every year.  Breast cancer (BRCA) gene test is recommended for women who have family members with BRCA-related cancers. Results of the assessment will determine the need for genetic counseling and BRCA1 and for BRCA2 testing. BRCA-related cancers include these types: ? Breast. This occurs in males or females. ? Ovarian. ? Tubal. This may also be called fallopian tube cancer. ? Cancer of the abdominal or pelvic lining (peritoneal cancer). ? Prostate. ? Pancreatic.  Cervical, Uterine, and Ovarian Cancer Your health care provider may recommend that you be screened regularly for cancer of the pelvic organs. These include your ovaries, uterus, and vagina. This screening involves a pelvic exam, which includes checking for microscopic changes to the surface of your cervix (Pap test).  For women ages 21-65, health care providers may recommend a pelvic exam and a Pap test every three years. For women ages 79-65, they may recommend the Pap test and pelvic exam, combined with testing for human papilloma virus (HPV), every five years. Some types of HPV increase your risk of cervical cancer. Testing for HPV may also be done on women of any age who have unclear Pap test results.  Other health care providers may not recommend any screening for nonpregnant women who are considered low risk for pelvic cancer and have no symptoms. Ask your health care provider if a screening pelvic exam is right for you.  If you have had past treatment for cervical cancer or a condition that could lead to cancer, you need Pap tests and screening for cancer for at least 20 years after your treatment. If Pap tests have been discontinued for you, your risk factors (such as having a new sexual partner) need to be  reassessed to determine if you should start having screenings again. Some women have medical problems that increase the chance of getting cervical cancer. In these cases, your health care provider may recommend that you have screening and Pap tests more often.  If you have a family history of uterine cancer or ovarian cancer, talk with your health care provider about genetic screening.  If you have vaginal bleeding after reaching menopause, tell your health care provider.  There are currently no reliable tests available to screen for ovarian cancer.  Lung Cancer Lung cancer screening is recommended for adults 69-62 years old who are at high risk for lung cancer because of a history of smoking. A yearly low-dose CT scan of the lungs is recommended if you:  Currently smoke.  Have a history of at least 30 pack-years of smoking and you currently smoke or have quit within the past 15 years. A pack-year is smoking an average of one pack of cigarettes per day for one year.  Yearly screening should:  Continue until it has been 15 years since you quit.  Stop if you develop a health problem that would prevent you from having lung cancer treatment.  Colorectal Cancer  This type of cancer can be detected and can often be prevented.  Routine colorectal cancer screening usually begins at  age 42 and continues through age 45.  If you have risk factors for colon cancer, your health care provider may recommend that you be screened at an earlier age.  If you have a family history of colorectal cancer, talk with your health care provider about genetic screening.  Your health care provider may also recommend using home test kits to check for hidden blood in your stool.  A small camera at the end of a tube can be used to examine your colon directly (sigmoidoscopy or colonoscopy). This is done to check for the earliest forms of colorectal cancer.  Direct examination of the colon should be repeated every  5-10 years until age 71. However, if early forms of precancerous polyps or small growths are found or if you have a family history or genetic risk for colorectal cancer, you may need to be screened more often.  Skin Cancer  Check your skin from head to toe regularly.  Monitor any moles. Be sure to tell your health care provider: ? About any new moles or changes in moles, especially if there is a change in a mole's shape or color. ? If you have a mole that is larger than the size of a pencil eraser.  If any of your family members has a history of skin cancer, especially at a young age, talk with your health care provider about genetic screening.  Always use sunscreen. Apply sunscreen liberally and repeatedly throughout the day.  Whenever you are outside, protect yourself by wearing long sleeves, pants, a wide-brimmed hat, and sunglasses.  What should I know about osteoporosis? Osteoporosis is a condition in which bone destruction happens more quickly than new bone creation. After menopause, you may be at an increased risk for osteoporosis. To help prevent osteoporosis or the bone fractures that can happen because of osteoporosis, the following is recommended:  If you are 46-71 years old, get at least 1,000 mg of calcium and at least 600 mg of vitamin D per day.  If you are older than age 55 but younger than age 65, get at least 1,200 mg of calcium and at least 600 mg of vitamin D per day.  If you are older than age 54, get at least 1,200 mg of calcium and at least 800 mg of vitamin D per day.  Smoking and excessive alcohol intake increase the risk of osteoporosis. Eat foods that are rich in calcium and vitamin D, and do weight-bearing exercises several times each week as directed by your health care provider. What should I know about how menopause affects my mental health? Depression may occur at any age, but it is more common as you become older. Common symptoms of depression  include:  Low or sad mood.  Changes in sleep patterns.  Changes in appetite or eating patterns.  Feeling an overall lack of motivation or enjoyment of activities that you previously enjoyed.  Frequent crying spells.  Talk with your health care provider if you think that you are experiencing depression. What should I know about immunizations? It is important that you get and maintain your immunizations. These include:  Tetanus, diphtheria, and pertussis (Tdap) booster vaccine.  Influenza every year before the flu season begins.  Pneumonia vaccine.  Shingles vaccine.  Your health care provider may also recommend other immunizations. This information is not intended to replace advice given to you by your health care provider. Make sure you discuss any questions you have with your health care provider. Document Released: 11/15/2005  Document Revised: 04/12/2016 Document Reviewed: 06/27/2015 Elsevier Interactive Patient Education  2018 [employer]  

## 2017-05-30 NOTE — Addendum Note (Signed)
Addended by: Lurlean Nanny on: 05/30/2017 03:03 PM   Modules accepted: Orders

## 2017-05-30 NOTE — Progress Notes (Addendum)
Subjective:    Patient ID: Wendy Wagner, female    DOB: Jun 06, 1966, 51 y.o.   MRN: [id number]  HPI  Pt presents to the clinic today for her annual exam.  Flu: never Tetanus: < 10 years Pap Smear: 02/2015 Mammogram: ? 2013, she plans on scheduling this year Colon Screening: never Vision Screening: annually Dentist: as needed, dentures  Diet: She does eat meat. She reports she does not really consumes fruits or veggies. She occasionally eats fried foods. She drinks mostly water. Exercise: None  Review of Systems      Past Medical History:  Diagnosis Date  . Childhood asthma   . GERD (gastroesophageal reflux disease)   . Heart murmur     Current Outpatient Prescriptions  Medication Sig Dispense Refill  . Aspirin-Acetaminophen (GOODYS BODY PAIN PO) Take by mouth as needed. Reported on 02/23/2016    . Hydrocodone-Homatropine 5-1.5 MG TABS Take 1 tablet by mouth every 8 (eight) hours as needed. 15 each 0  . ibuprofen (ADVIL,MOTRIN) 800 MG tablet take 1 tablet by mouth every 8 hours if needed 90 tablet 2   No current facility-administered medications for this visit.     Allergies  Allergen Reactions  . No Known Drug Allergy     Family History  Problem Relation Age of Onset  . Cancer Mother        brain  . Cancer Father        brain, lung  . Diabetes Neg Hx   . Heart disease Neg Hx   . Stroke Neg Hx     Social History   Social History  . Marital status: Single    Spouse name: N/A  . Number of children: N/A  . Years of education: N/A   Occupational History  . Not on file.   Social History Main Topics  . Smoking status: Never Smoker  . Smokeless tobacco: Never Used  . Alcohol use No  . Drug use: No  . Sexual activity: Not Currently   Other Topics Concern  . Not on file   Social History Narrative  . No narrative on file     Constitutional: Denies fever, malaise, fatigue, headache or abrupt weight changes.  HEENT: Denies eye pain, eye redness,  ear pain, ringing in the ears, wax buildup, runny nose, nasal congestion, bloody nose, or sore throat. Respiratory: Denies difficulty breathing, shortness of breath, cough or sputum production.   Cardiovascular: Denies chest pain, chest tightness, palpitations or swelling in the hands or feet.  Gastrointestinal: Denies abdominal pain, bloating, constipation, diarrhea or blood in the stool.  GU: Denies urgency, frequency, pain with urination, burning sensation, blood in urine, odor or discharge. Musculoskeletal: Pt reports back pain. Denies decrease in range of motion, difficulty with gait, muscle pain or joint swelling.  Skin: Denies redness, rashes, lesions or ulcercations.  Neurological: Denies dizziness, difficulty with memory, difficulty with speech or problems with balance and coordination.  Psych: Denies anxiety, depression, SI/HI.  No other specific complaints in a complete review of systems (except as listed in HPI above).  Objective:   Physical Exam   BP 122/80   Pulse 65   Temp 98.2 F (36.8 C) (Oral)   Ht 5\' 6"  (1.676 m)   Wt (!) 338 lb (153.3 kg)   SpO2 99%   BMI 54.55 kg/m  Wt Readings from Last 3 Encounters:  05/30/17 (!) 338 lb (153.3 kg)  01/09/17 (!) 341 lb (154.7 kg)  10/10/16 (!) 325 lb (147.4 kg)  General: Appears her stated age, obese in NAD. Skin: Warm, dry and intact.  HEENT: Head: normal shape and size; Eyes: sclera white, no icterus, conjunctiva pink, PERRLA and EOMs intact; Ears: Tm's gray and intact, normal light reflex; Throat/Mouth: Teeth present, mucosa pink and moist, no exudate, lesions or ulcerations noted.  Neck:  Neck supple, trachea midline. No masses, lumps or thyromegaly present.  Cardiovascular: Normal rate and rhythm. S1,S2 noted.  Murmur noted. Trace nonpitting BLE edema.  Pulmonary/Chest: Normal effort and positive vesicular breath sounds. No respiratory distress. No wheezes, rales or ronchi noted.  Abdomen: Soft and nontender. Normal  bowel sounds. No distention or masses noted. Liver, spleen and kidneys non palpable. Pelvic: Normal female anatomy. Cervix not visualized. Small amount of this white discharge noted. Adnexa non palpable.  Musculoskeletal: Strength 5/5 BUE/BLE. No difficulty with gait.  Neurological: Alert and oriented. Cranial nerves II-XII grossly intact. Coordination normal.  Psychiatric: Mood and affect normal. Behavior is normal. Judgment and thought content normal.     BMET    Component Value Date/Time   NA 144 05/29/2016 1445   K 4.4 05/29/2016 1445   CL 107 05/29/2016 1445   CO2 31 05/29/2016 1445   GLUCOSE 95 05/29/2016 1445   BUN 12 05/29/2016 1445   CREATININE 0.94 05/29/2016 1445   CALCIUM 9.3 05/29/2016 1445    Lipid Panel     Component Value Date/Time   CHOL 181 05/29/2016 1445   TRIG 96.0 05/29/2016 1445   HDL 69.90 05/29/2016 1445   CHOLHDL 3 05/29/2016 1445   VLDL 19.2 05/29/2016 1445   LDLCALC 92 05/29/2016 1445    CBC    Component Value Date/Time   WBC 5.2 05/29/2016 1445   RBC 4.39 05/29/2016 1445   HGB 12.1 05/29/2016 1445   HCT 37.0 05/29/2016 1445   PLT 331.0 05/29/2016 1445   MCV 84.5 05/29/2016 1445   MCHC 32.6 05/29/2016 1445   RDW 15.5 05/29/2016 1445    Hgb A1C Lab Results  Component Value Date   HGBA1C 6.2 05/29/2016          Assessment & Plan:   Preventative Health Maintenance:  Encouraged her to get a flu shot in the fall Tetanus UTD per her report, she declines booster She will call [facility] to schedule her mammogram, number provided Pap smear today, will add STD screening She declines colonoscopy or Cologuard Encouraged her to consume a balanced diet and exercise regimen Advised her to see an eye doctor and dentist annually Will check CBC, CMET, Lipid and A1C today  RTC in 1 year, sooner if needed Webb Silversmith, NP

## 2017-06-03 ENCOUNTER — Other Ambulatory Visit: Payer: Self-pay | Admitting: Internal Medicine

## 2017-06-03 LAB — CYTOLOGY - PAP
BACTERIAL VAGINITIS: POSITIVE — AB
CANDIDA VAGINITIS: NEGATIVE
Chlamydia: NEGATIVE
Diagnosis: NEGATIVE
NEISSERIA GONORRHEA: NEGATIVE
TRICH (WINDOWPATH): NEGATIVE

## 2017-06-03 MED ORDER — METRONIDAZOLE 0.75 % VA GEL: 1.0000 | Freq: Two times a day (BID) | VAGINAL | 0 refills | Status: DC

## 2017-06-06 ENCOUNTER — Telehealth: Payer: Self-pay | Admitting: Internal Medicine

## 2017-06-06 NOTE — Telephone Encounter (Signed)
Patient is returning Wendy Wagner's call.  She has a question about her results.

## 2017-10-31 ENCOUNTER — Ambulatory Visit: Payer: BLUE CROSS/BLUE SHIELD | Admitting: Internal Medicine

## 2017-10-31 ENCOUNTER — Encounter: Payer: Self-pay | Admitting: Internal Medicine

## 2017-10-31 VITALS — BP 126/84 | HR 80 | Temp 99.4°F | Wt 342.0 lb

## 2017-10-31 DIAGNOSIS — W108XXA Fall (on) (from) other stairs and steps, initial encounter: Secondary | ICD-10-CM | POA: Diagnosis not present

## 2017-10-31 DIAGNOSIS — M25562 Pain in left knee: Secondary | ICD-10-CM | POA: Diagnosis not present

## 2017-10-31 DIAGNOSIS — R059 Cough, unspecified: Secondary | ICD-10-CM

## 2017-10-31 DIAGNOSIS — R05 Cough: Secondary | ICD-10-CM

## 2017-10-31 DIAGNOSIS — M25561 Pain in right knee: Secondary | ICD-10-CM | POA: Diagnosis not present

## 2017-10-31 MED ORDER — NAPROXEN 500 MG PO TABS: 500.0000 mg | ORAL_TABLET | Freq: Two times a day (BID) | ORAL | 0 refills | Status: DC

## 2017-10-31 MED ORDER — HYDROCODONE-HOMATROPINE 5-1.5 MG PO TABS: 1.0000 | ORAL_TABLET | Freq: Three times a day (TID) | ORAL | 0 refills | Status: DC | PRN

## 2017-10-31 NOTE — Patient Instructions (Signed)

## 2017-10-31 NOTE — Progress Notes (Signed)
Subjective:    Patient ID: Wendy Wagner, female    DOB: 10-24-1965, 52 y.o.   MRN: [id number]  HPI  Pt presents to the clinic today with c/o knee swelling and pain. This started after a fall that occurred yesterday. She was walking down some steps, she stumbled and landed on both of her knees. She describes the pain as burning. The pain is worse with ambulation, especially walking up stair. She has noticed some swelling and bruising. She has not tried anything OTC for her symptoms.   She also c/o a cough. This started 2 days ago. The cough is non productive. She denies wheezing or SOB. She denies fever or body aches but has had some chills. She has tried Copywriter, advertising with minimal relief. She has a history of childhood asthma. She has not had sick contacts that she is aware of.  Review of Systems  Past Medical History:  Diagnosis Date  . Childhood asthma   . GERD (gastroesophageal reflux disease)   . Heart murmur     Current Outpatient Medications  Medication Sig Dispense Refill  . metroNIDAZOLE (METROGEL VAGINAL) 0.75 % vaginal gel Place 1 Applicatorful vaginally 2 (two) times daily. 70 g 0  . traMADol (ULTRAM) 50 MG tablet Take 50 mg by mouth every 6 (six) hours as needed.   0   No current facility-administered medications for this visit.     Allergies  Allergen Reactions  . No Known Drug Allergy     Family History  Problem Relation Age of Onset  . Cancer Mother        brain  . Cancer Father        brain, lung  . Diabetes Neg Hx   . Heart disease Neg Hx   . Stroke Neg Hx     Social History   Socioeconomic History  . Marital status: Single    Spouse name: Not on file  . Number of children: Not on file  . Years of education: Not on file  . Highest education level: Not on file  Social Needs  . Financial resource strain: Not on file  . Food insecurity - worry: Not on file  . Food insecurity - inability: Not on file  . Transportation needs - medical: Not on file   . Transportation needs - non-medical: Not on file  Occupational History  . Not on file  Tobacco Use  . Smoking status: Never Smoker  . Smokeless tobacco: Never Used  Substance and Sexual Activity  . Alcohol use: No    Alcohol/week: 0.0 oz  . Drug use: No  . Sexual activity: Not Currently  Other Topics Concern  . Not on file  Social History Narrative  . Not on file     Constitutional: Denies fever, malaise, fatigue, headache or abrupt weight changes.  HEENT: Denies eye pain, eye redness, ear pain, ringing in the ears, wax buildup, runny nose, nasal congestion, bloody nose, or sore throat. Respiratory: Pt reports cough. Denies difficulty breathing, shortness of breath.   Musculoskeletal: Pt reports bilateral knee pain. Denies  muscle pain.  Skin: Pt reports bruising of bilateral knees. Denies redness, rashes, lesions or ulcercations.    No other specific complaints in a complete review of systems (except as listed in HPI above).     Objective:   Physical Exam   BP 126/84   Pulse 80   Temp 99.4 F (37.4 C) (Oral)   Wt (!) 342 lb (155.1 kg)   SpO2  98%   BMI 55.20 kg/m  Wt Readings from Last 3 Encounters:  10/31/17 (!) 342 lb (155.1 kg)  05/30/17 (!) 338 lb (153.3 kg)  01/09/17 (!) 341 lb (154.7 kg)    General: Appears her stated age, obese in NAD. Skin: Bruising noted over bilateral knees. HEENT: Head: normal shape and size, no sinus tenderness noted; Ears: Tm's gray and intact, normal light reflex; Nose: mucosa pink and moist, septum midline; Throat/Mouth: Teeth present, mucosa pink and moist, no exudate, lesions or ulcerations noted.  Neck:  No adenopathy noted. Pulmonary/Chest: Normal effort and positive vesicular breath sounds. No respiratory distress. No wheezes, rales or ronchi noted.  Musculoskeletal: Normal extension, decreased flexion. Pain with palpation over bilateral bursa. No pain with palpation behind the knee. No difficulty with gait.   BMET      Component Value Date/Time   NA 141 05/30/2017 1510   K 4.3 05/30/2017 1510   CL 105 05/30/2017 1510   CO2 30 05/30/2017 1510   GLUCOSE 90 05/30/2017 1510   BUN 11 05/30/2017 1510   CREATININE 0.93 05/30/2017 1510   CALCIUM 9.3 05/30/2017 1510    Lipid Panel     Component Value Date/Time   CHOL 184 05/30/2017 1510   TRIG 124.0 05/30/2017 1510   HDL 60.50 05/30/2017 1510   CHOLHDL 3 05/30/2017 1510   VLDL 24.8 05/30/2017 1510   LDLCALC 99 05/30/2017 1510    CBC    Component Value Date/Time   WBC 4.8 05/30/2017 1510   RBC 4.40 05/30/2017 1510   HGB 12.3 05/30/2017 1510   HCT 38.5 05/30/2017 1510   PLT 310.0 05/30/2017 1510   MCV 87.6 05/30/2017 1510   MCHC 31.8 05/30/2017 1510   RDW 14.7 05/30/2017 1510    Hgb A1C Lab Results  Component Value Date   HGBA1C 6.2 05/30/2017           Assessment & Plan:   Cough:  Viral Start Mucinex OTC eRx for Hycodan pills (she does not like the taste of cough syrup)  Bilateral Knee Pain:  Likely traumatic bursitis Encouraged ice eRx for Naproxen 500 mg BID with food If no improvement, will obtain xrays of bilateral knees  Return precautions discussed Webb Silversmith, NP

## 2017-11-12 ENCOUNTER — Ambulatory Visit (INDEPENDENT_AMBULATORY_CARE_PROVIDER_SITE_OTHER)
Admission: RE | Admit: 2017-11-12 | Discharge: 2017-11-12 | Disposition: A | Discharge status: Home / Self Care | Payer: BLUE CROSS/BLUE SHIELD | Source: Ambulatory Visit | Attending: Internal Medicine | Admitting: Internal Medicine

## 2017-11-12 ENCOUNTER — Ambulatory Visit
Admission: RE | Admit: 2017-11-12 | Discharge: 2017-11-12 | Disposition: A | Discharge status: Home / Self Care | Payer: BLUE CROSS/BLUE SHIELD | Source: Ambulatory Visit | Attending: Internal Medicine | Admitting: Internal Medicine

## 2017-11-12 ENCOUNTER — Other Ambulatory Visit: Payer: Self-pay | Admitting: Internal Medicine

## 2017-11-12 DIAGNOSIS — S8990XA Unspecified injury of unspecified lower leg, initial encounter: Secondary | ICD-10-CM

## 2017-11-17 ENCOUNTER — Telehealth: Payer: Self-pay | Admitting: Internal Medicine

## 2017-11-17 NOTE — Telephone Encounter (Signed)
Copied from [place]. Topic: Quick Communication - Other Results >> Nov 17, 2017 12:14 PM Antonieta Iba C wrote: Pt called in to request her imaging results. Please advise.     CB: [id number]

## 2017-11-17 NOTE — Telephone Encounter (Signed)
See result note.  

## 2017-11-20 ENCOUNTER — Telehealth: Payer: Self-pay | Admitting: Internal Medicine

## 2017-11-20 NOTE — Telephone Encounter (Signed)
Copied from [place] [id number]. Topic: Quick Communication - See Telephone Encounter >> Nov 20, 2017  2:53 PM Ether Griffins B wrote: CRM for notification. See Telephone encounter for:  Pt requesting ibuprofen 800 mg be called in again due to the fall she had a couple a weeks ago. Please send to [place], [place] 11/20/17.

## 2017-11-21 MED ORDER — IBUPROFEN 800 MG PO TABS: 800.0000 mg | ORAL_TABLET | Freq: Three times a day (TID) | ORAL | 0 refills | Status: DC | PRN

## 2017-11-21 NOTE — Telephone Encounter (Signed)
Rx sent through e-scribe  

## 2017-12-23 ENCOUNTER — Other Ambulatory Visit: Payer: Self-pay | Admitting: Internal Medicine

## 2018-01-22 ENCOUNTER — Encounter (INDEPENDENT_AMBULATORY_CARE_PROVIDER_SITE_OTHER): Payer: BLUE CROSS/BLUE SHIELD

## 2018-01-26 ENCOUNTER — Ambulatory Visit (INDEPENDENT_AMBULATORY_CARE_PROVIDER_SITE_OTHER): Payer: BLUE CROSS/BLUE SHIELD | Admitting: Family Medicine

## 2018-01-28 ENCOUNTER — Encounter (INDEPENDENT_AMBULATORY_CARE_PROVIDER_SITE_OTHER): Payer: Self-pay | Admitting: Family Medicine

## 2018-01-28 ENCOUNTER — Ambulatory Visit (INDEPENDENT_AMBULATORY_CARE_PROVIDER_SITE_OTHER): Payer: BLUE CROSS/BLUE SHIELD | Admitting: Family Medicine

## 2018-01-28 VITALS — BP 110/74 | HR 55 | Temp 98.0°F | Ht 67.0 in | Wt 340.0 lb

## 2018-01-28 DIAGNOSIS — Z6841 Body Mass Index (BMI) 40.0 and over, adult: Secondary | ICD-10-CM | POA: Diagnosis not present

## 2018-01-28 DIAGNOSIS — R5383 Other fatigue: Secondary | ICD-10-CM

## 2018-01-28 DIAGNOSIS — R7303 Prediabetes: Secondary | ICD-10-CM | POA: Diagnosis not present

## 2018-01-28 DIAGNOSIS — Z1331 Encounter for screening for depression: Secondary | ICD-10-CM

## 2018-01-28 DIAGNOSIS — Z0289 Encounter for other administrative examinations: Secondary | ICD-10-CM

## 2018-01-28 DIAGNOSIS — R0600 Dyspnea, unspecified: Secondary | ICD-10-CM

## 2018-01-28 DIAGNOSIS — Z9189 Other specified personal risk factors, not elsewhere classified: Secondary | ICD-10-CM | POA: Diagnosis not present

## 2018-01-28 DIAGNOSIS — R0609 Other forms of dyspnea: Secondary | ICD-10-CM

## 2018-01-29 LAB — VITAMIN B12: VITAMIN B 12: 275 pg/mL (ref 232–1245)

## 2018-01-29 LAB — CBC WITH DIFFERENTIAL
BASOS: 1 %
Basophils Absolute: 0.1 10*3/uL (ref 0.0–0.2)
EOS (ABSOLUTE): 0.2 10*3/uL (ref 0.0–0.4)
EOS: 6 %
HEMATOCRIT: 39.4 % (ref 34.0–46.6)
Hemoglobin: 12.2 g/dL (ref 11.1–15.9)
Immature Grans (Abs): 0 10*3/uL (ref 0.0–0.1)
Immature Granulocytes: 0 %
LYMPHS ABS: 2 10*3/uL (ref 0.7–3.1)
Lymphs: 47 %
MCH: 26.7 pg (ref 26.6–33.0)
MCHC: 31 g/dL — AB (ref 31.5–35.7)
MCV: 86 fL (ref 79–97)
Monocytes Absolute: 0.4 10*3/uL (ref 0.1–0.9)
Monocytes: 8 %
NEUTROS ABS: 1.6 10*3/uL (ref 1.4–7.0)
NEUTROS PCT: 38 %
RBC: 4.57 x10E6/uL (ref 3.77–5.28)
RDW: 14.9 % (ref 12.3–15.4)
WBC: 4.3 10*3/uL (ref 3.4–10.8)

## 2018-01-29 LAB — HEMOGLOBIN A1C
Est. average glucose Bld gHb Est-mCnc: 123 mg/dL
Hgb A1c MFr Bld: 5.9 % — ABNORMAL HIGH (ref 4.8–5.6)

## 2018-01-29 LAB — COMPREHENSIVE METABOLIC PANEL
A/G RATIO: 1.5 (ref 1.2–2.2)
ALK PHOS: 87 IU/L (ref 39–117)
ALT: 16 IU/L (ref 0–32)
AST: 17 IU/L (ref 0–40)
Albumin: 4.3 g/dL (ref 3.5–5.5)
BILIRUBIN TOTAL: 0.4 mg/dL (ref 0.0–1.2)
BUN/Creatinine Ratio: 14 (ref 9–23)
BUN: 12 mg/dL (ref 6–24)
CALCIUM: 9.8 mg/dL (ref 8.7–10.2)
CHLORIDE: 105 mmol/L (ref 96–106)
CO2: 25 mmol/L (ref 20–29)
Creatinine, Ser: 0.88 mg/dL (ref 0.57–1.00)
GFR calc Af Amer: 86 mL/min/{1.73_m2} (ref >59)
GFR, EST NON AFRICAN AMERICAN: 75 mL/min/{1.73_m2} (ref >59)
GLOBULIN, TOTAL: 2.9 g/dL (ref 1.5–4.5)
Glucose: 97 mg/dL (ref 65–99)
POTASSIUM: 4.8 mmol/L (ref 3.5–5.2)
SODIUM: 144 mmol/L (ref 134–144)
Total Protein: 7.2 g/dL (ref 6.0–8.5)

## 2018-01-29 LAB — TSH: TSH: 0.888 u[IU]/mL (ref 0.450–4.500)

## 2018-01-29 LAB — LIPID PANEL WITH LDL/HDL RATIO
Cholesterol, Total: 187 mg/dL (ref 100–199)
HDL: 73 mg/dL (ref >39)
LDL Calculated: 94 mg/dL (ref 0–99)
LDL/HDL RATIO: 1.3 ratio (ref 0.0–3.2)
Triglycerides: 99 mg/dL (ref 0–149)
VLDL CHOLESTEROL CAL: 20 mg/dL (ref 5–40)

## 2018-01-29 LAB — FOLATE: Folate: 16.1 ng/mL (ref >3.0)

## 2018-01-29 LAB — T4, FREE: Free T4: 1.14 ng/dL (ref 0.82–1.77)

## 2018-01-29 LAB — INSULIN, RANDOM: INSULIN: 20.4 u[IU]/mL (ref 2.6–24.9)

## 2018-01-29 LAB — VITAMIN D 25 HYDROXY (VIT D DEFICIENCY, FRACTURES): VIT D 25 HYDROXY: 10.6 ng/mL — AB (ref 30.0–100.0)

## 2018-01-29 LAB — T3: T3, Total: 132 ng/dL (ref 71–180)

## 2018-02-02 NOTE — Progress Notes (Signed)
.  Office: [phone]  /  Fax: [phone]   HPI:   Chief Complaint: OBESITY  Wendy Wagner (MR# [id number]) is a 52 y.o. female who presents on 01/28/2018 for obesity evaluation and treatment. Current BMI is Body mass index is 53.25 kg/m.Marland Kitchen Wendy Wagner has struggled with obesity for years and has been unsuccessful in either losing weight or maintaining long term weight loss. Wendy Wagner attended our information session and states she is currently in the action stage of change and ready to dedicate time achieving and maintaining a healthier weight.   Wendy Wagner was told by co-worker about our clinic. Recently (within past few years) lost weight to 199 lbs.  Wendy Wagner states her desired weight loss is 95-140 lbs she has been heavy most of  her life she started gaining weight the last couple of years her heaviest weight ever was 345 lbs she is a picky eater and doesn't like to eat healthier foods  she has significant food cravings issues  she snacks frequently in the evenings she skips meals frequently she is frequently drinking liquids with calories she frequently makes poor food choices she frequently eats larger portions than normal  she struggles with emotional eating    Fatigue Wendy Wagner feels her energy is lower than it should be. This has worsened with weight gain and has not worsened recently. Wendy Wagner admits to daytime somnolence and  admits to waking up still tired. Patient is at risk for obstructive sleep apnea. Patent has a history of symptoms of daytime fatigue. Patient generally gets 5 hours of sleep per night, and states they generally have nightime awakenings. Snoring is present. Apneic episodes are not present. Epworth Sleepiness Score is 9.  Dyspnea on exertion Kanani notes increasing shortness of breath with exercising and seems to be worsening over time with weight gain. She notes getting out of breath sooner with activity than she used to. This has not gotten worse recently. EKG  within normal limits. Wendy Wagner denies orthopnea.  Pre-Diabetes Wendy Wagner has a diagnosis of pre-diabetes based on her elevated Hgb A1c and was informed this puts her at greater risk of developing diabetes. Her previous Hgb A1c of 6.2. She is not taking metformin currently and continues to work on diet and exercise to decrease risk of diabetes. She denies nausea or hypoglycemia.  At risk for diabetes Wendy Wagner is at higher than average risk for developing diabetes due to her obesity and pre-diabetes. She currently denies polyuria or polydipsia.  Depression Screen Wendy Wagner's Food and Mood (modified PHQ-9) score was  Depression screen PHQ 2/9 01/28/2018  Decreased Interest 3  Down, Depressed, Hopeless 1  PHQ - 2 Score 4  Altered sleeping 3  Tired, decreased energy 3  Change in appetite 2  Feeling bad or failure about yourself  0  Trouble concentrating 0  Moving slowly or fidgety/restless 0  Suicidal thoughts 0  PHQ-9 Score 12  Difficult doing work/chores Somewhat difficult    ALLERGIES: Allergies  Allergen Reactions  . No Known Drug Allergy     MEDICATIONS: Current Outpatient Medications on File Prior to Visit  Medication Sig Dispense Refill  . ibuprofen (ADVIL,MOTRIN) 800 MG tablet Take 1 tablet (800 mg total) by mouth every 8 (eight) hours as needed. 90 tablet 0  . naproxen (NAPROSYN) 500 MG tablet TAKE 1 TABLET BY MOUTH TWICE A DAY WITH FOOD 30 tablet 0  . traMADol (ULTRAM) 50 MG tablet Take 50 mg by mouth every 6 (six) hours as needed.   0   No current  facility-administered medications on file prior to visit.     PAST MEDICAL HISTORY: Past Medical History:  Diagnosis Date  . Anemia   . Back pain   . Childhood asthma   . GERD (gastroesophageal reflux disease)   . Heart murmur   . Leg edema     PAST SURGICAL HISTORY: Past Surgical History:  Procedure Laterality Date  . CESAREAN SECTION    . WISDOM TOOTH EXTRACTION      SOCIAL HISTORY: Social History   Tobacco  Use  . Smoking status: Former Smoker    Packs/day: 1.00    Years: 8.00    Pack years: 8.00    Types: Cigarettes  . Smokeless tobacco: Never Used  Substance Use Topics  . Alcohol use: No    Alcohol/week: 0.0 oz  . Drug use: No    FAMILY HISTORY: Family History  Problem Relation Age of Onset  . Cancer Mother        brain  . Cancer Father        brain, lung  . Alcoholism Father   . Diabetes Neg Hx   . Heart disease Neg Hx   . Stroke Neg Hx     ROS: Review of Systems  Constitutional: Positive for malaise/fatigue. Negative for weight loss.       + Trouble sleeping  HENT:       + Dentures + Dry mouth  Eyes:       + Wear glasses or contacts  Respiratory: Positive for shortness of breath.   Cardiovascular: Negative for orthopnea.  Gastrointestinal: Negative for nausea.  Genitourinary: Negative for frequency.  Musculoskeletal: Positive for back pain.  Endo/Heme/Allergies: Negative for polydipsia.       Negative hypoglycemia    PHYSICAL EXAM: Blood pressure 110/74, pulse (!) 55, temperature 98 F (36.7 C), temperature source Oral, height 5\' 7"  (1.702 m), weight (!) 340 lb (154.2 kg), SpO2 98 %. Body mass index is 53.25 kg/m. Physical Exam  Constitutional: She is oriented to person, place, and time. She appears well-developed and well-nourished.  HENT:  Head: Normocephalic and atraumatic.  Nose: Nose normal.  Eyes: EOM are normal. No scleral icterus.  Neck: Normal range of motion. Neck supple. No thyromegaly present.  Cardiovascular: Normal rate.  Murmur (3/6 systolic crescendo murmur) heard. Pulmonary/Chest: Effort normal. No respiratory distress.  Abdominal: Soft. There is no tenderness.  + Obesity  Musculoskeletal:  Range of Motion normal in all 4 extremities Trace edema noted in bilateral lower extremities  Neurological: She is alert and oriented to person, place, and time. Coordination normal.  Skin: Skin is warm and dry.  + Acanthosis nigricans  + Skin  tags  Psychiatric: She has a normal mood and affect. Her behavior is normal.  Vitals reviewed.   RECENT LABS AND TESTS: BMET    Component Value Date/Time   NA 144 01/28/2018 1147   K 4.8 01/28/2018 1147   CL 105 01/28/2018 1147   CO2 25 01/28/2018 1147   GLUCOSE 97 01/28/2018 1147   GLUCOSE 90 05/30/2017 1510   BUN 12 01/28/2018 1147   CREATININE 0.88 01/28/2018 1147   CALCIUM 9.8 01/28/2018 1147   GFRNONAA 75 01/28/2018 1147   GFRAA 86 01/28/2018 1147   Lab Results  Component Value Date   HGBA1C 5.9 (H) 01/28/2018   Lab Results  Component Value Date   INSULIN 20.4 01/28/2018   CBC    Component Value Date/Time   WBC 4.3 01/28/2018 1147   WBC 4.8 05/30/2017  1510   RBC 4.57 01/28/2018 1147   RBC 4.40 05/30/2017 1510   HGB 12.2 01/28/2018 1147   HCT 39.4 01/28/2018 1147   PLT 310.0 05/30/2017 1510   MCV 86 01/28/2018 1147   MCH 26.7 01/28/2018 1147   MCHC 31.0 (L) 01/28/2018 1147   MCHC 31.8 05/30/2017 1510   RDW 14.9 01/28/2018 1147   LYMPHSABS 2.0 01/28/2018 1147   EOSABS 0.2 01/28/2018 1147   BASOSABS 0.1 01/28/2018 1147   Iron/TIBC/Ferritin/ %Sat No results found for: IRON, TIBC, FERRITIN, IRONPCTSAT Lipid Panel     Component Value Date/Time   CHOL 187 01/28/2018 1147   TRIG 99 01/28/2018 1147   HDL 73 01/28/2018 1147   CHOLHDL 3 05/30/2017 1510   VLDL 24.8 05/30/2017 1510   LDLCALC 94 01/28/2018 1147   Hepatic Function Panel     Component Value Date/Time   PROT 7.2 01/28/2018 1147   ALBUMIN 4.3 01/28/2018 1147   AST 17 01/28/2018 1147   ALT 16 01/28/2018 1147   ALKPHOS 87 01/28/2018 1147   BILITOT 0.4 01/28/2018 1147      Component Value Date/Time   TSH 0.888 01/28/2018 1147   Vitamin D No recent labs  ECG  shows NSR with a rate of 60 BPM INDIRECT CALORIMETER done today shows a VO2 of 321 and a REE of 2236. Her calculated basal metabolic rate is 3299 thus her basal metabolic rate is better than expected.    ASSESSMENT AND  PLAN: Other fatigue - Plan: EKG 12-Lead, CBC With Differential, Comprehensive metabolic panel, Lipid Panel With LDL/HDL Ratio, VITAMIN D 25 Hydroxy (Vit-D Deficiency, Fractures), Vitamin B12, Folate, T3, T4, free, TSH, ECHOCARDIOGRAM COMPLETE, CANCELED: ECHOCARDIOGRAM COMPLETE  Dyspnea on exertion - Plan: ECHOCARDIOGRAM COMPLETE, CANCELED: ECHOCARDIOGRAM COMPLETE  Prediabetes - Plan: Hemoglobin A1c, Insulin, random  Depression screening  At risk for diabetes mellitus  Class 3 severe obesity with serious comorbidity and body mass index (BMI) of 50.0 to 59.9 in adult, unspecified obesity type (HCC)  PLAN:  Fatigue Wendy Wagner was informed that her fatigue may be related to obesity, depression or many other causes. Labs will be ordered, and in the meanwhile Wendy Wagner has agreed to work on diet, exercise and weight loss to help with fatigue. Proper sleep hygiene was discussed including the need for 7-8 hours of quality sleep each night. A sleep study was not ordered based on symptoms and Epworth score.  Dyspnea on exertion Wendy Wagner's shortness of breath appears to be obesity related and exercise induced. She has agreed to work on weight loss and gradually increase exercise to treat her exercise induced shortness of breath. If Wendy Wagner follows our instructions and loses weight without improvement of her shortness of breath, we will plan to refer to pulmonology. We will monitor this condition regularly. We will order an echocardiogram. Modest agrees to this plan.   Pre-Diabetes Wendy Wagner will continue to work on weight loss, exercise, and decreasing simple carbohydrates in her diet to help decrease the risk of diabetes. We dicussed metformin including benefits and risks. She was informed that eating too many simple carbohydrates or too many calories at one sitting increases the likelihood of GI side effects. Cerissa declined metformin for now and a prescription was not written today. We will check labs today  and Wendy Wagner agrees to follow up with our clinic in 2 weeks as directed to monitor her progress.  Diabetes risk counselling Wendy Wagner was given extended (15 minutes) diabetes prevention counseling today. She is 52 y.o. female and has risk  factors for diabetes including obesity and pre-diabetes. We discussed intensive lifestyle modifications today with an emphasis on weight loss as well as increasing exercise and decreasing simple carbohydrates in her diet.  Depression Screen Wendy Wagner had a moderately positive depression screening. Depression is commonly associated with obesity and often results in emotional eating behaviors. We will monitor this closely and work on CBT to help improve the non-hunger eating patterns. Referral to Psychology may be required if no improvement is seen as she continues in our clinic.  Obesity Wendy Wagner is currently in the action stage of change and her goal is to continue with weight loss efforts She has agreed to follow the Category 3 plan Wendy Wagner has been instructed to work up to a goal of 150 minutes of combined cardio and strengthening exercise per week for weight loss and overall health benefits. We discussed the following Behavioral Modification Strategies today: increasing lean protein intake, increasing vegetables, work on meal planning and easy cooking plans, better snacking choices, and planning for success  Wendy Wagner has agreed to follow up with our clinic in 2 weeks. She was informed of the importance of frequent follow up visits to maximize her success with intensive lifestyle modifications for her multiple health conditions. She was informed we would discuss her lab results at her next visit unless there is a critical issue that needs to be addressed sooner. Wendy Wagner agreed to keep her next visit at the agreed upon time to discuss these results.    OBESITY BEHAVIORAL INTERVENTION VISIT  Today's visit was # 1 out of 22.  Starting weight: 340 lbs Starting date:  01/28/18 Today's weight : 340 lbs Today's date: 01/28/2018 Total lbs lost to date: 0 (Patients must lose 7 lbs in the first 6 months to continue with counseling)   ASK: We discussed the diagnosis of obesity with Wendy Wagner today and Wendy Wagner agreed to give [place] permission to discuss obesity behavioral modification therapy today.  ASSESS: Wendy Wagner has the diagnosis of obesity and her BMI today is 53.24 Wendy Wagner is in the action stage of change   ADVISE: Wendy Wagner was educated on the multiple health risks of obesity as well as the benefit of weight loss to improve her health. She was advised of the need for long term treatment and the importance of lifestyle modifications.  AGREE: Multiple dietary modification options and treatment options were discussed and  Ethal agreed to the above obesity treatment plan.   I, Trixie Dredge, am acting as transcriptionist for Ilene Qua, MD   I have reviewed the above documentation for accuracy and completeness, and I agree with the above. - Ilene Qua, MD

## 2018-02-11 ENCOUNTER — Ambulatory Visit (INDEPENDENT_AMBULATORY_CARE_PROVIDER_SITE_OTHER): Payer: BLUE CROSS/BLUE SHIELD | Admitting: Family Medicine

## 2018-02-11 VITALS — BP 116/76 | HR 62 | Temp 98.2°F | Ht 67.0 in | Wt 330.0 lb

## 2018-02-11 DIAGNOSIS — E559 Vitamin D deficiency, unspecified: Secondary | ICD-10-CM | POA: Diagnosis not present

## 2018-02-11 DIAGNOSIS — Z9189 Other specified personal risk factors, not elsewhere classified: Secondary | ICD-10-CM | POA: Diagnosis not present

## 2018-02-11 DIAGNOSIS — R7303 Prediabetes: Secondary | ICD-10-CM | POA: Diagnosis not present

## 2018-02-11 DIAGNOSIS — Z6841 Body Mass Index (BMI) 40.0 and over, adult: Secondary | ICD-10-CM | POA: Diagnosis not present

## 2018-02-11 MED ORDER — VITAMIN D (ERGOCALCIFEROL) 1.25 MG (50000 UNIT) PO CAPS: 50000.0000 [IU] | ORAL_CAPSULE | ORAL | 0 refills | Status: DC

## 2018-02-11 MED ORDER — METFORMIN HCL 500 MG PO TABS: 500.0000 mg | ORAL_TABLET | Freq: Every day | ORAL | 0 refills | Status: DC

## 2018-02-11 NOTE — Progress Notes (Signed)
Office: [phone]  /  Fax: [phone]   HPI:   Chief Complaint: OBESITY Wendy Wagner is here to discuss her progress with her obesity treatment plan. She is on the Category 3 plan and is following her eating plan approximately 80 % of the time. She states she is exercising 0 minutes 0 times per week. Wendy Wagner found 3 eggs to be a significant amount. Didn't measure protein at lunch and most days didn't eat dinner.  Her weight is (!) 330 lb (149.7 kg) today and has had a weight loss of 10 pounds over a period of 2 weeks since her last visit. She has lost 10 lbs since starting treatment with [place].  Vitamin D Deficiency Wendy Wagner has a diagnosis of vitamin D deficiency. She is not on Vit D supplement currently and denies nausea, vomiting or muscle weakness.  Pre-Diabetes Wendy Wagner has a diagnosis of pre-diabetes based on her elevated Hgb A1c and was informed this puts her at greater risk of developing diabetes. Hgb A1c in pre-diabetes range for >3 years. She is not taking metformin currently and continues to work on diet and exercise to decrease risk of diabetes. She denies nausea or hypoglycemia.  At risk for diabetes Wendy Wagner is at higher than average risk for developing diabetes due to her obesity and pre-diabetes. She currently denies polyuria or polydipsia.  ALLERGIES: Allergies  Allergen Reactions  . No Known Drug Allergy     MEDICATIONS: Current Outpatient Medications on File Prior to Visit  Medication Sig Dispense Refill  . ibuprofen (ADVIL,MOTRIN) 800 MG tablet Take 1 tablet (800 mg total) by mouth every 8 (eight) hours as needed. 90 tablet 0  . naproxen (NAPROSYN) 500 MG tablet TAKE 1 TABLET BY MOUTH TWICE A DAY WITH FOOD 30 tablet 0  . traMADol (ULTRAM) 50 MG tablet Take 50 mg by mouth every 6 (six) hours as needed.   0   No current facility-administered medications on file prior to visit.     PAST MEDICAL HISTORY: Past Medical History:  Diagnosis Date  . Anemia   . Back  pain   . Childhood asthma   . GERD (gastroesophageal reflux disease)   . Heart murmur   . Leg edema     PAST SURGICAL HISTORY: Past Surgical History:  Procedure Laterality Date  . CESAREAN SECTION    . WISDOM TOOTH EXTRACTION      SOCIAL HISTORY: Social History   Tobacco Use  . Smoking status: Former Smoker    Packs/day: 1.00    Years: 8.00    Pack years: 8.00    Types: Cigarettes  . Smokeless tobacco: Never Used  Substance Use Topics  . Alcohol use: No    Alcohol/week: 0.0 oz  . Drug use: No    FAMILY HISTORY: Family History  Problem Relation Age of Onset  . Cancer Mother        brain  . Cancer Father        brain, lung  . Alcoholism Father   . Diabetes Neg Hx   . Heart disease Neg Hx   . Stroke Neg Hx     ROS: Review of Systems  Constitutional: Positive for weight loss.  Gastrointestinal: Negative for nausea and vomiting.  Genitourinary: Negative for frequency.  Musculoskeletal:       Negative muscle weakness  Endo/Heme/Allergies: Negative for polydipsia.       Negative hypoglycemia    PHYSICAL EXAM: Blood pressure 116/76, pulse 62, temperature 98.2 F (36.8 C), temperature source Oral, height  5\' 7"  (1.702 m), weight (!) 330 lb (149.7 kg), SpO2 98 %. Body mass index is 51.69 kg/m. Physical Exam  Constitutional: She is oriented to person, place, and time. She appears well-developed and well-nourished.  Cardiovascular: Normal rate.  Pulmonary/Chest: Effort normal.  Musculoskeletal: Normal range of motion.  Neurological: She is oriented to person, place, and time.  Skin: Skin is warm and dry.  Psychiatric: She has a normal mood and affect. Her behavior is normal.  Vitals reviewed.   RECENT LABS AND TESTS: BMET    Component Value Date/Time   NA 144 01/28/2018 1147   K 4.8 01/28/2018 1147   CL 105 01/28/2018 1147   CO2 25 01/28/2018 1147   GLUCOSE 97 01/28/2018 1147   GLUCOSE 90 05/30/2017 1510   BUN 12 01/28/2018 1147   CREATININE 0.88  01/28/2018 1147   CALCIUM 9.8 01/28/2018 1147   GFRNONAA 75 01/28/2018 1147   GFRAA 86 01/28/2018 1147   Lab Results  Component Value Date   HGBA1C 5.9 (H) 01/28/2018   HGBA1C 6.2 05/30/2017   HGBA1C 6.2 05/29/2016   HGBA1C 5.8 02/23/2015   Lab Results  Component Value Date   INSULIN 20.4 01/28/2018   CBC    Component Value Date/Time   WBC 4.3 01/28/2018 1147   WBC 4.8 05/30/2017 1510   RBC 4.57 01/28/2018 1147   RBC 4.40 05/30/2017 1510   HGB 12.2 01/28/2018 1147   HCT 39.4 01/28/2018 1147   PLT 310.0 05/30/2017 1510   MCV 86 01/28/2018 1147   MCH 26.7 01/28/2018 1147   MCHC 31.0 (L) 01/28/2018 1147   MCHC 31.8 05/30/2017 1510   RDW 14.9 01/28/2018 1147   LYMPHSABS 2.0 01/28/2018 1147   EOSABS 0.2 01/28/2018 1147   BASOSABS 0.1 01/28/2018 1147   Iron/TIBC/Ferritin/ %Sat No results found for: IRON, TIBC, FERRITIN, IRONPCTSAT Lipid Panel     Component Value Date/Time   CHOL 187 01/28/2018 1147   TRIG 99 01/28/2018 1147   HDL 73 01/28/2018 1147   CHOLHDL 3 05/30/2017 1510   VLDL 24.8 05/30/2017 1510   LDLCALC 94 01/28/2018 1147   Hepatic Function Panel     Component Value Date/Time   PROT 7.2 01/28/2018 1147   ALBUMIN 4.3 01/28/2018 1147   AST 17 01/28/2018 1147   ALT 16 01/28/2018 1147   ALKPHOS 87 01/28/2018 1147   BILITOT 0.4 01/28/2018 1147      Component Value Date/Time   TSH 0.888 01/28/2018 1147   TSH 0.79 02/23/2015 1512  Results for [name], [name] (MRN [id number]) as of 02/11/2018 18:11  Ref. Range 01/28/2018 11:47  Vitamin D, 25-Hydroxy Latest Ref Range: 30.0 - 100.0 ng/mL 10.6 (L)    ASSESSMENT AND PLAN: Vitamin D deficiency - Plan: Vitamin D, Ergocalciferol, (DRISDOL) 50000 units CAPS capsule  Prediabetes - Plan: metFORMIN (GLUCOPHAGE) 500 MG tablet  At risk for diabetes mellitus  Class 3 severe obesity with serious comorbidity and body mass index (BMI) of 50.0 to 59.9 in adult, unspecified obesity type ([place])  PLAN:  Vitamin D  Deficiency Wendy Wagner was informed that low vitamin D levels contributes to fatigue and are associated with obesity, breast, and colon cancer. Wendy Wagner agrees to start prescription Vit D @50 ,000 IU every week #4 with no refills. She will follow up for routine testing of vitamin D, at least 2-3 times per year. She was informed of the risk of over-replacement of vitamin D and agrees to not increase her dose unless she discusses this with [place] first. Wendy Wagner  agrees to follow up with our clinic in 2 weeks.  Pre-Diabetes Wendy Wagner will continue to work on weight loss, exercise, and decreasing simple carbohydrates in her diet to help decrease the risk of diabetes. We dicussed metformin including benefits and risks. She was informed that eating too many simple carbohydrates or too many calories at one sitting increases the likelihood of GI side effects. Wendy Wagner agrees to start metformin 500 mg PO q AM #30 with no refills. Wendy Wagner agrees to follow up with our clinic in 2 weeks as directed to monitor her progress.  Diabetes risk counselling Wendy Wagner was given extended (30 minutes) diabetes prevention counseling today. She is 52 y.o. female and has risk factors for diabetes including obesity and pre-diabetes. We discussed intensive lifestyle modifications today with an emphasis on weight loss as well as increasing exercise and decreasing simple carbohydrates in her diet.  Obesity Wendy Wagner is currently in the action stage of change. As such, her goal is to continue with weight loss efforts She has agreed to follow the Category 3 plan Wendy Wagner has been instructed to work up to a goal of 150 minutes of combined cardio and strengthening exercise per week for weight loss and overall health benefits. We discussed the following Behavioral Modification Strategies today: increasing lean protein intake, increasing vegetables, work on meal planning and easy cooking plans, keeping healthy foods in the home, travel eating strategies,  and planning for success   Wendy Wagner has agreed to follow up with our clinic in 2 weeks. She was informed of the importance of frequent follow up visits to maximize her success with intensive lifestyle modifications for her multiple health conditions.   OBESITY BEHAVIORAL INTERVENTION VISIT  Today's visit was # 2 out of 22.  Starting weight: 340 lbs Starting date: 01/28/18 Today's weight : 330 lbs Today's date: 02/11/2018 Total lbs lost to date: 10 (Patients must lose 7 lbs in the first 6 months to continue with counseling)   ASK: We discussed the diagnosis of obesity with Wendy Wagner today and Wendy Wagner agreed to give [place] permission to discuss obesity behavioral modification therapy today.  ASSESS: Wendy Wagner has the diagnosis of obesity and her BMI today is 51.67 Wendy Wagner is in the action stage of change   ADVISE: Wendy Wagner was educated on the multiple health risks of obesity as well as the benefit of weight loss to improve her health. She was advised of the need for long term treatment and the importance of lifestyle modifications.  AGREE: Multiple dietary modification options and treatment options were discussed and  Wendy Wagner agreed to the above obesity treatment plan.  I, Trixie Dredge, am acting as transcriptionist for Ilene Qua, MD  I have reviewed the above documentation for accuracy and completeness, and I agree with the above. - Ilene Qua, MD

## 2018-02-23 ENCOUNTER — Ambulatory Visit (HOSPITAL_COMMUNITY): Payer: BLUE CROSS/BLUE SHIELD

## 2018-02-26 ENCOUNTER — Ambulatory Visit (INDEPENDENT_AMBULATORY_CARE_PROVIDER_SITE_OTHER): Payer: BLUE CROSS/BLUE SHIELD | Admitting: Family Medicine

## 2018-02-26 VITALS — BP 109/72 | HR 65 | Temp 97.9°F | Ht 67.0 in | Wt 325.0 lb

## 2018-02-26 DIAGNOSIS — Z6841 Body Mass Index (BMI) 40.0 and over, adult: Secondary | ICD-10-CM | POA: Diagnosis not present

## 2018-02-26 DIAGNOSIS — R7303 Prediabetes: Secondary | ICD-10-CM | POA: Diagnosis not present

## 2018-02-26 DIAGNOSIS — E559 Vitamin D deficiency, unspecified: Secondary | ICD-10-CM | POA: Diagnosis not present

## 2018-02-27 ENCOUNTER — Other Ambulatory Visit: Payer: Self-pay

## 2018-02-27 ENCOUNTER — Ambulatory Visit (HOSPITAL_COMMUNITY): Payer: BLUE CROSS/BLUE SHIELD | Attending: Cardiology

## 2018-02-27 DIAGNOSIS — R0600 Dyspnea, unspecified: Secondary | ICD-10-CM

## 2018-02-27 DIAGNOSIS — R5383 Other fatigue: Secondary | ICD-10-CM | POA: Diagnosis present

## 2018-02-27 DIAGNOSIS — R011 Cardiac murmur, unspecified: Secondary | ICD-10-CM | POA: Insufficient documentation

## 2018-02-27 DIAGNOSIS — Z87891 Personal history of nicotine dependence: Secondary | ICD-10-CM | POA: Insufficient documentation

## 2018-02-27 DIAGNOSIS — R609 Edema, unspecified: Secondary | ICD-10-CM | POA: Insufficient documentation

## 2018-02-27 DIAGNOSIS — R0609 Other forms of dyspnea: Secondary | ICD-10-CM | POA: Insufficient documentation

## 2018-02-27 DIAGNOSIS — J45909 Unspecified asthma, uncomplicated: Secondary | ICD-10-CM | POA: Insufficient documentation

## 2018-03-03 NOTE — Progress Notes (Signed)
Office: [phone]  /  Fax: [phone]   HPI:   Chief Complaint: OBESITY Wendy Wagner is here to discuss her progress with her obesity treatment plan. She is on the Category 3 plan and is following her eating plan approximately 90 % of the time. She states she is exercising 0 minutes 0 times per week. Wendy Wagner is still struggling with the dinner portion most nights. She hasn't experimented with moving any food around. She is looking for other options for dinner. Her weight is (!) 325 lb (147.4 kg) today and has had a weight loss of 5 pounds over a period of 2 weeks since her last visit. She has lost 15 lbs since starting treatment with [place].  Vitamin D deficiency Wendy Wagner has a diagnosis of vitamin D deficiency. She is currently taking vit D. Wendy Wagner admits fatigue and denies nausea, vomiting or muscle weakness.  Pre-Diabetes Wendy Wagner has a diagnosis of prediabetes based on her elevated Hgb A1c of 5.9 and was informed this puts her at greater risk of developing diabetes. She is taking metformin currently and continues to work on diet and exercise to decrease risk of diabetes. She denies nausea or hypoglycemia.  ALLERGIES: Allergies  Allergen Reactions  . No Known Drug Allergy     MEDICATIONS: Current Outpatient Medications on File Prior to Visit  Medication Sig Dispense Refill  . ibuprofen (ADVIL,MOTRIN) 800 MG tablet Take 1 tablet (800 mg total) by mouth every 8 (eight) hours as needed. 90 tablet 0  . metFORMIN (GLUCOPHAGE) 500 MG tablet Take 1 tablet (500 mg total) by mouth daily with breakfast. 30 tablet 0  . naproxen (NAPROSYN) 500 MG tablet TAKE 1 TABLET BY MOUTH TWICE A DAY WITH FOOD 30 tablet 0  . traMADol (ULTRAM) 50 MG tablet Take 50 mg by mouth every 6 (six) hours as needed.   0  . Vitamin D, Ergocalciferol, (DRISDOL) 50000 units CAPS capsule Take 1 capsule (50,000 Units total) by mouth every 7 (seven) days. 4 capsule 0   No current facility-administered medications on file  prior to visit.     PAST MEDICAL HISTORY: Past Medical History:  Diagnosis Date  . Anemia   . Back pain   . Childhood asthma   . GERD (gastroesophageal reflux disease)   . Heart murmur   . Leg edema     PAST SURGICAL HISTORY: Past Surgical History:  Procedure Laterality Date  . CESAREAN SECTION    . WISDOM TOOTH EXTRACTION      SOCIAL HISTORY: Social History   Tobacco Use  . Smoking status: Former Smoker    Packs/day: 1.00    Years: 8.00    Pack years: 8.00    Types: Cigarettes  . Smokeless tobacco: Never Used  Substance Use Topics  . Alcohol use: No    Alcohol/week: 0.0 oz  . Drug use: No    FAMILY HISTORY: Family History  Problem Relation Age of Onset  . Cancer Mother        brain  . Cancer Father        brain, lung  . Alcoholism Father   . Diabetes Neg Hx   . Heart disease Neg Hx   . Stroke Neg Hx     ROS: Review of Systems  Constitutional: Positive for malaise/fatigue and weight loss.  Gastrointestinal: Negative for nausea and vomiting.  Musculoskeletal:       Negative for muscle weakness  Endo/Heme/Allergies:       Negative for hypoglycemia    PHYSICAL EXAM:  Blood pressure 109/72, pulse 65, temperature 97.9 F (36.6 C), temperature source Oral, height 5\' 7"  (1.702 m), weight (!) 325 lb (147.4 kg), SpO2 98 %. Body mass index is 50.9 kg/m. Physical Exam  Constitutional: She is oriented to person, place, and time. She appears well-developed and well-nourished.  Cardiovascular: Normal rate.  Pulmonary/Chest: Effort normal.  Musculoskeletal: Normal range of motion.  Neurological: She is oriented to person, place, and time.  Skin: Skin is warm and dry.  Psychiatric: She has a normal mood and affect. Her behavior is normal.  Vitals reviewed.   RECENT LABS AND TESTS: BMET    Component Value Date/Time   NA 144 01/28/2018 1147   K 4.8 01/28/2018 1147   CL 105 01/28/2018 1147   CO2 25 01/28/2018 1147   GLUCOSE 97 01/28/2018 1147   GLUCOSE  90 05/30/2017 1510   BUN 12 01/28/2018 1147   CREATININE 0.88 01/28/2018 1147   CALCIUM 9.8 01/28/2018 1147   GFRNONAA 75 01/28/2018 1147   GFRAA 86 01/28/2018 1147   Lab Results  Component Value Date   HGBA1C 5.9 (H) 01/28/2018   HGBA1C 6.2 05/30/2017   HGBA1C 6.2 05/29/2016   HGBA1C 5.8 02/23/2015   Lab Results  Component Value Date   INSULIN 20.4 01/28/2018   CBC    Component Value Date/Time   WBC 4.3 01/28/2018 1147   WBC 4.8 05/30/2017 1510   RBC 4.57 01/28/2018 1147   RBC 4.40 05/30/2017 1510   HGB 12.2 01/28/2018 1147   HCT 39.4 01/28/2018 1147   PLT 310.0 05/30/2017 1510   MCV 86 01/28/2018 1147   MCH 26.7 01/28/2018 1147   MCHC 31.0 (L) 01/28/2018 1147   MCHC 31.8 05/30/2017 1510   RDW 14.9 01/28/2018 1147   LYMPHSABS 2.0 01/28/2018 1147   EOSABS 0.2 01/28/2018 1147   BASOSABS 0.1 01/28/2018 1147   Iron/TIBC/Ferritin/ %Sat No results found for: IRON, TIBC, FERRITIN, IRONPCTSAT Lipid Panel     Component Value Date/Time   CHOL 187 01/28/2018 1147   TRIG 99 01/28/2018 1147   HDL 73 01/28/2018 1147   CHOLHDL 3 05/30/2017 1510   VLDL 24.8 05/30/2017 1510   LDLCALC 94 01/28/2018 1147   Hepatic Function Panel     Component Value Date/Time   PROT 7.2 01/28/2018 1147   ALBUMIN 4.3 01/28/2018 1147   AST 17 01/28/2018 1147   ALT 16 01/28/2018 1147   ALKPHOS 87 01/28/2018 1147   BILITOT 0.4 01/28/2018 1147      Component Value Date/Time   TSH 0.888 01/28/2018 1147   TSH 0.79 02/23/2015 1512   Results for Wendy Wagner, Wendy Wagner (MRN [id number]) as of 03/03/2018 09:10  Ref. Range 01/28/2018 11:47  Vitamin D, 25-Hydroxy Latest Ref Range: 30.0 - 100.0 ng/mL 10.6 (L)   ASSESSMENT AND PLAN: Vitamin D deficiency  Prediabetes  Class 3 severe obesity with serious comorbidity and body mass index (BMI) of 50.0 to 59.9 in adult, unspecified obesity type (HCC)  PLAN:  Vitamin D Deficiency Wendy Wagner was informed that low vitamin D levels contributes to fatigue and are  associated with obesity, breast, and colon cancer. She agrees to continue to take prescription Vit D @50 ,000 IU every week (no refill needed) and will follow up for routine testing of vitamin D, at least 2-3 times per year. She was informed of the risk of over-replacement of vitamin D and agrees to not increase her dose unless she discusses this with [place] first.  Pre-Diabetes Wendy Wagner will continue to work on [employer]  loss, exercise, and decreasing simple carbohydrates in her diet to help decrease the risk of diabetes. We dicussed metformin including benefits and risks. She was informed that eating too many simple carbohydrates or too many calories at one sitting increases the likelihood of GI side effects. Wendy Wagner agreed to continue  metformin (no refill needed). Wendy Wagner agreed to follow up with [place] as directed to monitor her progress.  We spent > than 50% of the 15 minute visit on the counseling as documented in the note.  Obesity Wendy Wagner is currently in the action stage of change. As such, her goal is to continue with weight loss efforts She has agreed to keep a food journal with 450 to 600 calories and 40 grams of protein at supper daily and follow the Category 3 Wendy Wagner has been instructed to work up to a goal of 150 minutes of combined cardio and strengthening exercise per week for weight loss and overall health benefits. We discussed the following Behavioral Modification Strategies today: better snacking choices, planning for success, keep a strict food journal,. increasing lean protein intake and work on meal planning and easy cooking plans  Wendy Wagner has agreed to follow up with our clinic in 3 weeks. She was informed of the importance of frequent follow up visits to maximize her success with intensive lifestyle modifications for her multiple health conditions.   OBESITY BEHAVIORAL INTERVENTION VISIT  Today's visit was # 3 out of 22.  Starting weight: 340 lbs Starting date:  01/28/18 Today's weight : 325 lbs Today's date: 02/26/2018 Total lbs lost to date: 15 (Patients must lose 7 lbs in the first 6 months to continue with counseling)   ASK: We discussed the diagnosis of obesity with Wendy Wagner today and Wendy Wagner agreed to give [place] permission to discuss obesity behavioral modification therapy today.  ASSESS: Wendy Wagner has the diagnosis of obesity and her BMI today is 50.89 Wendy Wagner is in the action stage of change   ADVISE: Wendy Wagner was educated on the multiple health risks of obesity as well as the benefit of weight loss to improve her health. She was advised of the need for long term treatment and the importance of lifestyle modifications.  AGREE: Multiple dietary modification options and treatment options were discussed and  Wendy Wagner agreed to the above obesity treatment plan.  I, Doreene Nest, am acting as transcriptionist for Eber Jones, MD  I have reviewed the above documentation for accuracy and completeness, and I agree with the above. - Ilene Qua, MD

## 2018-03-12 ENCOUNTER — Encounter: Payer: Self-pay | Admitting: Family Medicine

## 2018-03-12 ENCOUNTER — Ambulatory Visit: Payer: BLUE CROSS/BLUE SHIELD | Admitting: Family Medicine

## 2018-03-12 DIAGNOSIS — N898 Other specified noninflammatory disorders of vagina: Secondary | ICD-10-CM | POA: Diagnosis not present

## 2018-03-12 MED ORDER — TRIAMCINOLONE ACETONIDE 0.1 % EX CREA: 1.0000 "application " | TOPICAL_CREAM | Freq: Two times a day (BID) | CUTANEOUS | 0 refills | Status: DC

## 2018-03-12 NOTE — Progress Notes (Signed)
   Subjective:    Patient ID: Wendy Wagner, female    DOB: 1966-04-10, 52 y.o.   MRN: [id number]  Vaginal Itching  The patient's primary symptoms include genital itching. This is a new problem. The current episode started in the past 7 days ( 2-3 days). The problem occurs constantly. The problem has been gradually worsening. The pain is mild. The problem affects the left side. She is not pregnant. Pertinent negatives include no abdominal pain, chills, discolored urine, dysuria, fever, frequency or nausea. Associated symptoms comments:  Obese  no discharge. Nothing aggravates the symptoms. She has tried nothing for the symptoms. She is not sexually active. No, her partner does not have an STD. She is postmenopausal. Her past medical history is significant for herpes simplex and vaginosis. There is no history of perineal abscess or an STD.   No ulcer , no soreness.   She has no new exposure... No new detergent. No douche, no cream.  She has recently start metformin, vit D.   Review of Systems  Constitutional: Negative for chills and fever.  Gastrointestinal: Negative for abdominal pain and nausea.  Genitourinary: Negative for dysuria and frequency.       Objective:   Physical Exam  Constitutional: Vital signs are normal. She appears well-developed and well-nourished. She is cooperative.  Non-toxic appearance. She does not appear ill. No distress.  HENT:  Head: Normocephalic.  Right Ear: Hearing, tympanic membrane, external ear and ear canal normal. Tympanic membrane is not erythematous, not retracted and not bulging.  Left Ear: Hearing, tympanic membrane, external ear and ear canal normal. Tympanic membrane is not erythematous, not retracted and not bulging.  Nose: No mucosal edema or rhinorrhea. Right sinus exhibits no maxillary sinus tenderness and no frontal sinus tenderness. Left sinus exhibits no maxillary sinus tenderness and no frontal sinus tenderness.  Mouth/Throat: Uvula is  midline, oropharynx is clear and moist and mucous membranes are normal.  Eyes: Pupils are equal, round, and reactive to light. Conjunctivae, EOM and lids are normal. Lids are everted and swept, no foreign bodies found.  Neck: Trachea normal and normal range of motion. Neck supple. Carotid bruit is not present. No thyroid mass and no thyromegaly present.  Cardiovascular: Normal rate, regular rhythm, S1 normal, S2 normal, normal heart sounds, intact distal pulses and normal pulses. Exam reveals no gallop and no friction rub.  No murmur heard. Pulmonary/Chest: Effort normal and breath sounds normal. No tachypnea. No respiratory distress. She has no decreased breath sounds. She has no wheezes. She has no rhonchi. She has no rales.  Abdominal: Soft. Normal appearance and bowel sounds are normal. There is no tenderness.  Genitourinary:     Genitourinary Comments:  Dry skin  Purplish erythema, no white striations  no blisters, no ulcer, nontender.. Just itchy.  Neurological: She is alert.  Skin: Skin is warm, dry and intact. No rash noted.  Psychiatric: Her speech is normal and behavior is normal. Judgment and thought content normal. Her mood appears not anxious. Cognition and memory are normal. She does not exhibit a depressed mood.          Assessment & Plan:

## 2018-03-12 NOTE — Addendum Note (Signed)
Addended by: Carter Kitten on: 03/12/2018 04:34 PM   Modules accepted: Orders

## 2018-03-12 NOTE — Patient Instructions (Signed)
We will call with viral culture result. Apply topical steroid cream for 2 weeks.. If not improving call for referral to GYN.

## 2018-03-12 NOTE — Assessment & Plan Note (Signed)
Appears most consitent with allergic dermatitis.  Will make sure viral culture rules out herpes simplex infeciton but does not clearly appear to be an ulcer.   Also on differential.. Postmenopausal atrophy, lichen planus, carcinoma etc.   Will try trial of steroid cream but if not improving and staying away.. Consider gyn referral for bx.

## 2018-03-16 LAB — HERPES SIMPLEX VIRUS CULTURE
MICRO NUMBER:: 90681806
SPECIMEN QUALITY:: ADEQUATE

## 2018-03-23 ENCOUNTER — Ambulatory Visit (INDEPENDENT_AMBULATORY_CARE_PROVIDER_SITE_OTHER): Payer: BLUE CROSS/BLUE SHIELD | Admitting: Family Medicine

## 2018-03-23 VITALS — BP 105/70 | HR 66 | Temp 98.1°F | Ht 67.0 in | Wt 323.0 lb

## 2018-03-23 DIAGNOSIS — E559 Vitamin D deficiency, unspecified: Secondary | ICD-10-CM | POA: Diagnosis not present

## 2018-03-23 DIAGNOSIS — Z9189 Other specified personal risk factors, not elsewhere classified: Secondary | ICD-10-CM | POA: Diagnosis not present

## 2018-03-23 DIAGNOSIS — Z6841 Body Mass Index (BMI) 40.0 and over, adult: Secondary | ICD-10-CM

## 2018-03-23 DIAGNOSIS — R7303 Prediabetes: Secondary | ICD-10-CM | POA: Diagnosis not present

## 2018-03-23 MED ORDER — VITAMIN D (ERGOCALCIFEROL) 1.25 MG (50000 UNIT) PO CAPS: 50000.0000 [IU] | ORAL_CAPSULE | ORAL | 0 refills | Status: DC

## 2018-03-23 MED ORDER — METFORMIN HCL 500 MG PO TABS
500.0000 mg | ORAL_TABLET | Freq: Every day | ORAL | 0 refills | Status: DC
Start: 2018-03-23 — End: 2018-04-08

## 2018-03-23 NOTE — Progress Notes (Signed)
Office: [phone]  /  Fax: [phone]   HPI:   Chief Complaint: OBESITY Wendy Wagner is here to discuss her progress with her obesity treatment plan. She is on the keep a food journal with 450 to 600 calories and 40 grams of protein at supper daily and the Category 3 plan and is following her eating plan approximately 80 % of the time. She states she is exercising 0 minutes 0 times per week. Wendy Wagner is not journaling for dinner, alas she is not meeting her protein goals overall. She is deviating more and has had increased celebration eating. Her weight is (!) 323 lb (146.5 kg) today and has had a weight loss of 2 pounds over a period of 3 weeks since her last visit. She has lost 17 lbs since starting treatment with [place].  Pre-Diabetes Wendy Wagner has a diagnosis of prediabetes based on her elevated Hgb A1c and was informed this puts her at greater risk of developing diabetes. Wendy Wagner is stable on metformin and she continues to work on diet and exercise to decrease risk of diabetes. Wendy Wagner has decreased polyphagia and she denies nausea or hypoglycemia.  At risk for diabetes Wendy Wagner is at higher than average risk for developing diabetes due to her obesity and pre-diabetes. She currently denies polyuria or polydipsia.  Vitamin D deficiency Wendy Wagner has a diagnosis of vitamin D deficiency. Wendy Wagner is currently taking vit D, but she is not yet at goal. She denies nausea, vomiting or muscle weakness.  ALLERGIES: Allergies  Allergen Reactions  . No Known Drug Allergy     MEDICATIONS: Current Outpatient Medications on File Prior to Visit  Medication Sig Dispense Refill  . ibuprofen (ADVIL,MOTRIN) 800 MG tablet Take 1 tablet (800 mg total) by mouth every 8 (eight) hours as needed. 90 tablet 0  . naproxen (NAPROSYN) 500 MG tablet TAKE 1 TABLET BY MOUTH TWICE A DAY WITH FOOD 30 tablet 0  . traMADol (ULTRAM) 50 MG tablet Take 50 mg by mouth every 6 (six) hours as needed.   0  . triamcinolone cream  (KENALOG) 0.1 % Apply 1 application topically 2 (two) times daily. 30 g 0   No current facility-administered medications on file prior to visit.     PAST MEDICAL HISTORY: Past Medical History:  Diagnosis Date  . Anemia   . Back pain   . Childhood asthma   . GERD (gastroesophageal reflux disease)   . Heart murmur   . Leg edema     PAST SURGICAL HISTORY: Past Surgical History:  Procedure Laterality Date  . CESAREAN SECTION    . WISDOM TOOTH EXTRACTION      SOCIAL HISTORY: Social History   Tobacco Use  . Smoking status: Former Smoker    Packs/day: 1.00    Years: 8.00    Pack years: 8.00    Types: Cigarettes  . Smokeless tobacco: Never Used  Substance Use Topics  . Alcohol use: No    Alcohol/week: 0.0 oz  . Drug use: No    FAMILY HISTORY: Family History  Problem Relation Age of Onset  . Cancer Mother        brain  . Cancer Father        brain, lung  . Alcoholism Father   . Diabetes Neg Hx   . Heart disease Neg Hx   . Stroke Neg Hx     ROS: Review of Systems  Constitutional: Positive for weight loss.  Gastrointestinal: Negative for nausea and vomiting.  Genitourinary: Negative for frequency.  Musculoskeletal:       Negative for muscle weakness  Endo/Heme/Allergies: Negative for polydipsia.       Positive for polyphagia Negative for hypoglycemia    PHYSICAL EXAM: Blood pressure 105/70, pulse 66, temperature 98.1 F (36.7 C), temperature source Oral, height 5\' 7"  (1.702 m), weight (!) 323 lb (146.5 kg), SpO2 98 %. Body mass index is 50.59 kg/m. Physical Exam  Constitutional: She is oriented to person, place, and time. She appears well-developed and well-nourished.  Cardiovascular: Normal rate.  Pulmonary/Chest: Effort normal.  Musculoskeletal: Normal range of motion.  Neurological: She is oriented to person, place, and time.  Skin: Skin is warm and dry.  Psychiatric: She has a normal mood and affect. Her behavior is normal.  Vitals  reviewed.   RECENT LABS AND TESTS: BMET    Component Value Date/Time   NA 144 01/28/2018 1147   K 4.8 01/28/2018 1147   CL 105 01/28/2018 1147   CO2 25 01/28/2018 1147   GLUCOSE 97 01/28/2018 1147   GLUCOSE 90 05/30/2017 1510   BUN 12 01/28/2018 1147   CREATININE 0.88 01/28/2018 1147   CALCIUM 9.8 01/28/2018 1147   GFRNONAA 75 01/28/2018 1147   GFRAA 86 01/28/2018 1147   Lab Results  Component Value Date   HGBA1C 5.9 (H) 01/28/2018   HGBA1C 6.2 05/30/2017   HGBA1C 6.2 05/29/2016   HGBA1C 5.8 02/23/2015   Lab Results  Component Value Date   INSULIN 20.4 01/28/2018   CBC    Component Value Date/Time   WBC 4.3 01/28/2018 1147   WBC 4.8 05/30/2017 1510   RBC 4.57 01/28/2018 1147   RBC 4.40 05/30/2017 1510   HGB 12.2 01/28/2018 1147   HCT 39.4 01/28/2018 1147   PLT 310.0 05/30/2017 1510   MCV 86 01/28/2018 1147   MCH 26.7 01/28/2018 1147   MCHC 31.0 (L) 01/28/2018 1147   MCHC 31.8 05/30/2017 1510   RDW 14.9 01/28/2018 1147   LYMPHSABS 2.0 01/28/2018 1147   EOSABS 0.2 01/28/2018 1147   BASOSABS 0.1 01/28/2018 1147   Iron/TIBC/Ferritin/ %Sat No results found for: IRON, TIBC, FERRITIN, IRONPCTSAT Lipid Panel     Component Value Date/Time   CHOL 187 01/28/2018 1147   TRIG 99 01/28/2018 1147   HDL 73 01/28/2018 1147   CHOLHDL 3 05/30/2017 1510   VLDL 24.8 05/30/2017 1510   LDLCALC 94 01/28/2018 1147   Hepatic Function Panel     Component Value Date/Time   PROT 7.2 01/28/2018 1147   ALBUMIN 4.3 01/28/2018 1147   AST 17 01/28/2018 1147   ALT 16 01/28/2018 1147   ALKPHOS 87 01/28/2018 1147   BILITOT 0.4 01/28/2018 1147      Component Value Date/Time   TSH 0.888 01/28/2018 1147   TSH 0.79 02/23/2015 1512   Results for ROSY, ESTABROOK (MRN [id number]) as of 03/23/2018 15:02  Ref. Range 01/28/2018 11:47  Vitamin D, 25-Hydroxy Latest Ref Range: 30.0 - 100.0 ng/mL 10.6 (L)   ASSESSMENT AND PLAN: Prediabetes - Plan: metFORMIN (GLUCOPHAGE) 500 MG  tablet  Vitamin D deficiency - Plan: Vitamin D, Ergocalciferol, (DRISDOL) 50000 units CAPS capsule  At risk for diabetes mellitus  Class 3 severe obesity with serious comorbidity and body mass index (BMI) of 50.0 to 59.9 in adult, unspecified obesity type ([place])  PLAN:  Pre-Diabetes Wendy Wagner will continue to work on weight loss, exercise, and decreasing simple carbohydrates in her diet to help decrease the risk of diabetes. We dicussed metformin including benefits and risks. She was  informed that eating too many simple carbohydrates or too many calories at one sitting increases the likelihood of GI side effects. Charnika requested metformin for now and a prescription was written today for 1 month refill. Wendy Wagner agreed to follow up with [place] as directed to monitor her progress.  Diabetes risk counseling Wendy Wagner was given extended (15 minutes) diabetes prevention counseling today. She is 52 y.o. female and has risk factors for diabetes including obesity and pre-diabetes. We discussed intensive lifestyle modifications today with an emphasis on weight loss as well as increasing exercise and decreasing simple carbohydrates in her diet.  Vitamin D Deficiency Wendy Wagner was informed that low vitamin D levels contributes to fatigue and are associated with obesity, breast, and colon cancer. She agrees to continue to take prescription Vit D @50 ,000 IU every week #4 with no refills and will follow up for routine testing of vitamin D, at least 2-3 times per year. She was informed of the risk of over-replacement of vitamin D and agrees to not increase her dose unless she discusses this with [place] first. Wendy Wagner agrees to follow up as directed.     Obesity Wendy Wagner is currently in the action stage of change. As such, her goal is to continue with weight loss efforts She has agreed to keep a food journal with 400 to 500 calories and 35 grams of protein at lunch daily and follow the Category 3 plan Wendy Wagner has been  instructed to work up to a goal of 150 minutes of combined cardio and strengthening exercise per week for weight loss and overall health benefits. We discussed the following Behavioral Modification Strategies today: increasing lean protein intake, decreasing simple carbohydrates , work on meal planning and easy cooking plans and celebration eating strategies   Wendy Wagner has agreed to follow up with our clinic in 2 to 3 weeks. She was informed of the importance of frequent follow up visits to maximize her success with intensive lifestyle modifications for her multiple health conditions.   OBESITY BEHAVIORAL INTERVENTION VISIT  Today's visit was # 4 out of 22.  Starting weight: 340 lbs Starting date: 01/28/18 Today's weight : 323 lbs  Today's date: 03/23/2018 Total lbs lost to date: 17 (Patients must lose 7 lbs in the first 6 months to continue with counseling)   ASK: We discussed the diagnosis of obesity with Wendy Wagner today and Wendy Wagner agreed to give [place] permission to discuss obesity behavioral modification therapy today.  ASSESS: Simren has the diagnosis of obesity and her BMI today is 50.58 Wendy Wagner is in the action stage of change   ADVISE: Wendy Wagner was educated on the multiple health risks of obesity as well as the benefit of weight loss to improve her health. She was advised of the need for long term treatment and the importance of lifestyle modifications.  AGREE: Multiple dietary modification options and treatment options were discussed and  Wendy Wagner agreed to the above obesity treatment plan.  I, Doreene Nest, am acting as transcriptionist for Dennard Nip, MD  I have reviewed the above documentation for accuracy and completeness, and I agree with the above. -Dennard Nip, MD

## 2018-04-08 ENCOUNTER — Ambulatory Visit (INDEPENDENT_AMBULATORY_CARE_PROVIDER_SITE_OTHER): Payer: BLUE CROSS/BLUE SHIELD | Admitting: Family Medicine

## 2018-04-08 VITALS — BP 110/73 | HR 73 | Temp 98.2°F | Ht 67.0 in | Wt 325.0 lb

## 2018-04-08 DIAGNOSIS — Z6841 Body Mass Index (BMI) 40.0 and over, adult: Secondary | ICD-10-CM | POA: Diagnosis not present

## 2018-04-08 DIAGNOSIS — E559 Vitamin D deficiency, unspecified: Secondary | ICD-10-CM | POA: Diagnosis not present

## 2018-04-08 DIAGNOSIS — Z9189 Other specified personal risk factors, not elsewhere classified: Secondary | ICD-10-CM | POA: Diagnosis not present

## 2018-04-08 DIAGNOSIS — R7303 Prediabetes: Secondary | ICD-10-CM

## 2018-04-08 MED ORDER — VITAMIN D (ERGOCALCIFEROL) 1.25 MG (50000 UNIT) PO CAPS: 50000.0000 [IU] | ORAL_CAPSULE | ORAL | 0 refills | Status: DC

## 2018-04-08 MED ORDER — METFORMIN HCL 500 MG PO TABS: 500.0000 mg | ORAL_TABLET | Freq: Every day | ORAL | 0 refills | Status: DC

## 2018-04-08 NOTE — Progress Notes (Signed)
Office: [phone]  /  Fax: [phone]   HPI:   Chief Complaint: OBESITY Wendy Wagner is here to discuss her progress with her obesity treatment plan. She is on the keep a food journal with 400-500 calories and 35 grams of protein at lunch daily and follow the Category 3 plan and is following her eating plan approximately 80 % of the time. She states she is exercising 0 minutes 0 times per week. Margaurite has had increased celebration eating. She is eating breakfast and vegetables every day. She is working on [employer] protein but [employer]. She is mostly doing portion control and smarter food choices.  Her weight is (!) 325 lb (147.4 kg) today and has gained 2 pounds since her last visit. She has lost 15 lbs since starting treatment with [place].  Pre-Diabetes Clydell has a diagnosis of pre-diabetes based on her elevated Hgb A1c and was informed this puts her at greater risk of developing diabetes. She is working on diet and taking metformin, she denies nausea, vomiting, or hypoglycemia, but still notes polyphagia.  Vitamin D Deficiency Margrette has a diagnosis of vitamin D deficiency. She is stable on prescription Vit D, not yet at goal. She still notes fatigue and denies nausea, vomiting or muscle weakness.  At risk for cardiovascular disease Conleigh is at a higher than average risk for cardiovascular disease due to obesity. She currently denies any chest pain.  ALLERGIES: Allergies  Allergen Reactions  . No Known Drug Allergy     MEDICATIONS: Current Outpatient Medications on File Prior to Visit  Medication Sig Dispense Refill  . ibuprofen (ADVIL,MOTRIN) 800 MG tablet Take 1 tablet (800 mg total) by mouth every 8 (eight) hours as needed. 90 tablet 0  . naproxen (NAPROSYN) 500 MG tablet TAKE 1 TABLET BY MOUTH TWICE A DAY WITH FOOD 30 tablet 0  . traMADol (ULTRAM) 50 MG tablet Take 50 mg by mouth every 6 (six) hours as needed.   0  . triamcinolone cream (KENALOG) 0.1 % Apply 1  application topically 2 (two) times daily. 30 g 0   No current facility-administered medications on file prior to visit.     PAST MEDICAL HISTORY: Past Medical History:  Diagnosis Date  . Anemia   . Back pain   . Childhood asthma   . GERD (gastroesophageal reflux disease)   . Heart murmur   . Leg edema     PAST SURGICAL HISTORY: Past Surgical History:  Procedure Laterality Date  . CESAREAN SECTION    . WISDOM TOOTH EXTRACTION      SOCIAL HISTORY: Social History   Tobacco Use  . Smoking status: Former Smoker    Packs/day: 1.00    Years: 8.00    Pack years: 8.00    Types: Cigarettes  . Smokeless tobacco: Never Used  Substance Use Topics  . Alcohol use: No    Alcohol/week: 0.0 oz  . Drug use: No    FAMILY HISTORY: Family History  Problem Relation Age of Onset  . Cancer Mother        brain  . Cancer Father        brain, lung  . Alcoholism Father   . Diabetes Neg Hx   . Heart disease Neg Hx   . Stroke Neg Hx     ROS: Review of Systems  Constitutional: Positive for malaise/fatigue. Negative for weight loss.  Cardiovascular: Negative for chest pain.  Gastrointestinal: Negative for nausea and vomiting.  Musculoskeletal:  Negative muscle weakness  Endo/Heme/Allergies:       Negative hypoglycemia Positive polyphagia    PHYSICAL EXAM: Blood pressure 110/73, pulse 73, temperature 98.2 F (36.8 C), temperature source Oral, height 5\' 7"  (1.702 m), weight (!) 325 lb (147.4 kg), SpO2 98 %. Body mass index is 50.9 kg/m. Physical Exam  Constitutional: She is oriented to person, place, and time. She appears well-developed and well-nourished.  Cardiovascular: Normal rate.  Pulmonary/Chest: Effort normal.  Musculoskeletal: Normal range of motion.  Neurological: She is oriented to person, place, and time.  Skin: Skin is warm and dry.  Psychiatric: She has a normal mood and affect. Her behavior is normal.  Vitals reviewed.   RECENT LABS AND TESTS: BMET     Component Value Date/Time   NA 144 01/28/2018 1147   K 4.8 01/28/2018 1147   CL 105 01/28/2018 1147   CO2 25 01/28/2018 1147   GLUCOSE 97 01/28/2018 1147   GLUCOSE 90 05/30/2017 1510   BUN 12 01/28/2018 1147   CREATININE 0.88 01/28/2018 1147   CALCIUM 9.8 01/28/2018 1147   GFRNONAA 75 01/28/2018 1147   GFRAA 86 01/28/2018 1147   Lab Results  Component Value Date   HGBA1C 5.9 (H) 01/28/2018   HGBA1C 6.2 05/30/2017   HGBA1C 6.2 05/29/2016   HGBA1C 5.8 02/23/2015   Lab Results  Component Value Date   INSULIN 20.4 01/28/2018   CBC    Component Value Date/Time   WBC 4.3 01/28/2018 1147   WBC 4.8 05/30/2017 1510   RBC 4.57 01/28/2018 1147   RBC 4.40 05/30/2017 1510   HGB 12.2 01/28/2018 1147   HCT 39.4 01/28/2018 1147   PLT 310.0 05/30/2017 1510   MCV 86 01/28/2018 1147   MCH 26.7 01/28/2018 1147   MCHC 31.0 (L) 01/28/2018 1147   MCHC 31.8 05/30/2017 1510   RDW 14.9 01/28/2018 1147   LYMPHSABS 2.0 01/28/2018 1147   EOSABS 0.2 01/28/2018 1147   BASOSABS 0.1 01/28/2018 1147   Iron/TIBC/Ferritin/ %Sat No results found for: IRON, TIBC, FERRITIN, IRONPCTSAT Lipid Panel     Component Value Date/Time   CHOL 187 01/28/2018 1147   TRIG 99 01/28/2018 1147   HDL 73 01/28/2018 1147   CHOLHDL 3 05/30/2017 1510   VLDL 24.8 05/30/2017 1510   LDLCALC 94 01/28/2018 1147   Hepatic Function Panel     Component Value Date/Time   PROT 7.2 01/28/2018 1147   ALBUMIN 4.3 01/28/2018 1147   AST 17 01/28/2018 1147   ALT 16 01/28/2018 1147   ALKPHOS 87 01/28/2018 1147   BILITOT 0.4 01/28/2018 1147      Component Value Date/Time   TSH 0.888 01/28/2018 1147   TSH 0.79 02/23/2015 1512  Results for CELES, DEDIC (MRN [id number]) as of 04/08/2018 16:16  Ref. Range 01/28/2018 11:47  Vitamin D, 25-Hydroxy Latest Ref Range: 30.0 - 100.0 ng/mL 10.6 (L)    ASSESSMENT AND PLAN: Prediabetes - Plan: metFORMIN (GLUCOPHAGE) 500 MG tablet  Vitamin D deficiency - Plan: Vitamin D,  Ergocalciferol, (DRISDOL) 50000 units CAPS capsule  At risk for heart disease  Class 3 severe obesity with serious comorbidity and body mass index (BMI) of 50.0 to 59.9 in adult, unspecified obesity type ([place])  PLAN:  Pre-Diabetes Muriel will continue to work on weight loss, exercise, and decreasing simple carbohydrates in her diet to help decrease the risk of diabetes. We dicussed metformin including benefits and risks. She was informed that eating too many simple carbohydrates or too many calories at one  sitting increases the likelihood of GI side effects. Raney agrees to continue taking metformin 500 mg q AM #30 and we will refill for 1 month. Oviya agrees to follow up with our clinic in 3 weeks as directed to monitor her progress.  Vitamin D Deficiency Kathlen was informed that low vitamin D levels contributes to fatigue and are associated with obesity, breast, and colon cancer. Daesia agrees to continue taking prescription Vit D @50 ,000 IU every week #4 and we will refill for 1 month. She will follow up for routine testing of vitamin D, at least 2-3 times per year. She was informed of the risk of over-replacement of vitamin D and agrees to not increase her dose unless she discusses this with [place] first. Azie agrees to follow up with our clinic in 3 weeks.  Cardiovascular risk counselling Mignon was given extended (15 minutes) coronary artery disease prevention counseling today. She is 52 y.o. female and has risk factors for heart disease including obesity. We discussed intensive lifestyle modifications today with an emphasis on specific weight loss instructions and strategies. Pt was also informed of the importance of increasing exercise and decreasing saturated fats to help prevent heart disease.  Obesity Carisma is currently in the action stage of change. As such, her goal is to continue with weight loss efforts She has agreed to keep a food journal with 400-500 calories and 35  grams of protein at lunch daily and follow the Category 3 plan Deion has been instructed to work up to a goal of 150 minutes of combined cardio and strengthening exercise per week for weight loss and overall health benefits. We discussed the following Behavioral Modification Strategies today: work on meal planning and easy cooking plans, holiday eating strategies, and travel eating strategies   Jovanna has agreed to follow up with our clinic in 3 weeks. She was informed of the importance of frequent follow up visits to maximize her success with intensive lifestyle modifications for her multiple health conditions.   OBESITY BEHAVIORAL INTERVENTION VISIT  Today's visit was # 5 out of 22.  Starting weight: 340 lbs Starting date: 01/28/18 Today's weight : 325 lbs  Today's date: 04/08/2018 Total lbs lost to date: 15 (Patients must lose 7 lbs in the first 6 months to continue with counseling)   ASK: We discussed the diagnosis of obesity with Albin Fischer today and Camrynn agreed to give [place] permission to discuss obesity behavioral modification therapy today.  ASSESS: Blaine has the diagnosis of obesity and her BMI today is 50.89 Jazmarie is in the action stage of change   ADVISE: Tattiana was educated on the multiple health risks of obesity as well as the benefit of weight loss to improve her health. She was advised of the need for long term treatment and the importance of lifestyle modifications.  AGREE: Multiple dietary modification options and treatment options were discussed and  Dorri agreed to the above obesity treatment plan.  I, Trixie Dredge, am acting as transcriptionist for Dennard Nip, MD  I have reviewed the above documentation for accuracy and completeness, and I agree with the above. -Dennard Nip, MD

## 2018-04-15 ENCOUNTER — Encounter: Payer: Self-pay | Admitting: Family Medicine

## 2018-04-15 ENCOUNTER — Encounter: Payer: Self-pay | Admitting: Emergency Medicine

## 2018-04-15 ENCOUNTER — Ambulatory Visit: Payer: BLUE CROSS/BLUE SHIELD | Admitting: Family Medicine

## 2018-04-15 VITALS — BP 132/82 | HR 67 | Temp 98.5°F | Ht 67.0 in | Wt 325.8 lb

## 2018-04-15 DIAGNOSIS — J069 Acute upper respiratory infection, unspecified: Secondary | ICD-10-CM

## 2018-04-15 MED ORDER — BENZONATATE 100 MG PO CAPS: 100.0000 mg | ORAL_CAPSULE | Freq: Three times a day (TID) | ORAL | 0 refills | Status: DC | PRN

## 2018-04-15 NOTE — Progress Notes (Signed)
Subjective:    Patient ID: Wendy Wagner, female    DOB: 06/17/1966, 52 y.o.   MRN: [id number]  HPI This is a 52 yo female who presents today with nasal congestion, body aches, cough x 1 week. Works at a day care. No fever. Some sweats/chills. Cough with some sputum. No wheeze, feels a little SOB, fatigued. Frequent white nasal drainage. No ear pain, no sore throat. Headache and tightness. Has tried daytime/nighttime cold meds- little relief.  Since end of June has had upper back pain.   Past Medical History:  Diagnosis Date  . Anemia   . Back pain   . Childhood asthma   . GERD (gastroesophageal reflux disease)   . Heart murmur   . Leg edema    Past Surgical History:  Procedure Laterality Date  . CESAREAN SECTION    . WISDOM TOOTH EXTRACTION     Family History  Problem Relation Age of Onset  . Cancer Mother        brain  . Cancer Father        brain, lung  . Alcoholism Father   . Diabetes Neg Hx   . Heart disease Neg Hx   . Stroke Neg Hx    Social History   Tobacco Use  . Smoking status: Former Smoker    Packs/day: 1.00    Years: 8.00    Pack years: 8.00    Types: Cigarettes  . Smokeless tobacco: Never Used  Substance Use Topics  . Alcohol use: No    Alcohol/week: 0.0 oz  . Drug use: No      Review of Systems Per HPI    Objective:   Physical Exam  Constitutional: She is oriented to person, place, and time. She appears well-developed and well-nourished. No distress.  HENT:  Head: Normocephalic and atraumatic.  Right Ear: Tympanic membrane, external ear and ear canal normal.  Left Ear: Tympanic membrane, external ear and ear canal normal.  Nose: Mucosal edema and rhinorrhea present.  Mouth/Throat: Uvula is midline, oropharynx is clear and moist and mucous membranes are normal.  Sounds congested, constant sniffling.   Eyes: Conjunctivae are normal.  Neck: Normal range of motion. Neck supple.  Cardiovascular: Normal rate, regular rhythm and normal heart  sounds.  Pulmonary/Chest: Effort normal and breath sounds normal.  Lymphadenopathy:    She has no cervical adenopathy.  Neurological: She is alert and oriented to person, place, and time.  Skin: Skin is warm and dry. She is not diaphoretic.  Psychiatric: She has a normal mood and affect. Her behavior is normal. Judgment and thought content normal.  Vitals reviewed.     BP 132/82 (BP Location: Left Arm, Patient Position: Sitting, Cuff Size: Large)   Pulse 67   Temp 98.5 F (36.9 C) (Oral)   Ht 5\' 7"  (1.702 m)   Wt (!) 325 lb 12 oz (147.8 kg)   SpO2 99%   BMI 51.02 kg/m  Wt Readings from Last 3 Encounters:  04/15/18 (!) 325 lb 12 oz (147.8 kg)  04/08/18 (!) 325 lb (147.4 kg)  03/23/18 (!) 323 lb (146.5 kg)       Assessment & Plan:  1. URI with cough and congestion - Provided written and verbal information regarding diagnosis and treatment. - out of work for next two days - symptomatic treatment measures discussed  - RTC precautions reviewed - benzonatate (TESSALON) 100 MG capsule; Take 1-2 capsules (100-200 mg total) by mouth 3 (three) times daily as needed.  Dispense:  20 capsule; Refill: 0   Clarene Reamer, FNP-BC  [facility]  04/15/2018 3:04 PM

## 2018-04-15 NOTE — Patient Instructions (Signed)
Good to see you today, hope you are feeling better soon!  For nasal congestion you can use Afrin nasal spray for 3 days max, saline nasal spray (generic is fine for all). Take an over the counter anithistamine like Cetirizine or loratadine to help with drainage.  Drink enough fluids to make your urine light yellow. For fever/chill/muscle aches you can take over the counter acetaminophen or ibuprofen.  Please come back in if you are not better in 5-7 days or if you develop wheezing, shortness of breath or persistent vomiting.

## 2018-04-29 ENCOUNTER — Ambulatory Visit (INDEPENDENT_AMBULATORY_CARE_PROVIDER_SITE_OTHER): Payer: BLUE CROSS/BLUE SHIELD | Admitting: Family Medicine

## 2018-04-29 VITALS — BP 113/71 | HR 66 | Temp 98.0°F | Ht 67.0 in | Wt 325.0 lb

## 2018-04-29 DIAGNOSIS — R7303 Prediabetes: Secondary | ICD-10-CM | POA: Diagnosis not present

## 2018-04-29 DIAGNOSIS — E559 Vitamin D deficiency, unspecified: Secondary | ICD-10-CM

## 2018-04-29 DIAGNOSIS — Z6841 Body Mass Index (BMI) 40.0 and over, adult: Secondary | ICD-10-CM | POA: Diagnosis not present

## 2018-04-29 NOTE — Progress Notes (Signed)
Office: [phone]  /  Fax: [phone]   HPI:   Chief Complaint: OBESITY Wendy Wagner is here to discuss her progress with her obesity treatment plan. She is on the  keep a food journal with 400 to 500 calories and 35 grams of protein at lunch daily and the Category 3 plan and is following her eating plan approximately 40 % of the time. She states she is exercising 0 minutes 0 times per week. Wendy Wagner struggled to follow the category 3 plan. She has had a sinus infection and states that she has not been able to eat all the meat on the plan. Her weight is (!) 325 lb (147.4 kg) today and has maintianed weight over a period of 3 weeks since her last visit. She has lost 15 lbs since starting treatment with [place].  Vitamin D deficiency Wendy Wagner has a diagnosis of vitamin D deficiency. She is on prescription vit D and last level was not at goal. [name] denies nausea, vomiting or muscle weakness.  Pre-Diabetes Wendy Wagner has a diagnosis of prediabetes based on her elevated Hgb A1c and was informed this puts her at greater risk of developing diabetes. She is taking metformin currently and continues to work on diet and exercise to decrease risk of diabetes. She denies nausea, polyphagia or hypoglycemia.  ALLERGIES: Allergies  Allergen Reactions  . No Known Drug Allergy     MEDICATIONS: Current Outpatient Medications on File Prior to Visit  Medication Sig Dispense Refill  . benzonatate (TESSALON) 100 MG capsule Take 1-2 capsules (100-200 mg total) by mouth 3 (three) times daily as needed. 20 capsule 0  . ibuprofen (ADVIL,MOTRIN) 800 MG tablet Take 1 tablet (800 mg total) by mouth every 8 (eight) hours as needed. 90 tablet 0  . metFORMIN (GLUCOPHAGE) 500 MG tablet Take 1 tablet (500 mg total) by mouth daily with breakfast. 30 tablet 0  . naproxen (NAPROSYN) 500 MG tablet TAKE 1 TABLET BY MOUTH TWICE A DAY WITH FOOD 30 tablet 0  . traMADol (ULTRAM) 50 MG tablet Take 50 mg by mouth every 6 (six) hours as  needed.   0  . triamcinolone cream (KENALOG) 0.1 % Apply 1 application topically 2 (two) times daily. 30 g 0  . Vitamin D, Ergocalciferol, (DRISDOL) 50000 units CAPS capsule Take 1 capsule (50,000 Units total) by mouth every 7 (seven) days. 4 capsule 0   No current facility-administered medications on file prior to visit.     PAST MEDICAL HISTORY: Past Medical History:  Diagnosis Date  . Anemia   . Back pain   . Childhood asthma   . GERD (gastroesophageal reflux disease)   . Heart murmur   . Leg edema     PAST SURGICAL HISTORY: Past Surgical History:  Procedure Laterality Date  . CESAREAN SECTION    . WISDOM TOOTH EXTRACTION      SOCIAL HISTORY: Social History   Tobacco Use  . Smoking status: Former Smoker    Packs/day: 1.00    Years: 8.00    Pack years: 8.00    Types: Cigarettes  . Smokeless tobacco: Never Used  Substance Use Topics  . Alcohol use: No    Alcohol/week: 0.0 oz  . Drug use: No    FAMILY HISTORY: Family History  Problem Relation Age of Onset  . Cancer Mother        brain  . Cancer Father        brain, lung  . Alcoholism Father   . Diabetes Neg Hx   .  Heart disease Neg Hx   . Stroke Neg Hx     ROS: Review of Systems  Constitutional: Negative for weight loss.  Gastrointestinal: Negative for nausea and vomiting.  Musculoskeletal:       Negative for muscle weakness  Endo/Heme/Allergies:       Negative for polyphagia Negative for hypoglycemia    PHYSICAL EXAM: Blood pressure 113/71, pulse 66, temperature 98 F (36.7 C), temperature source Oral, height 5\' 7"  (1.702 m), weight (!) 325 lb (147.4 kg), SpO2 100 %. Body mass index is 50.9 kg/m. Physical Exam  Constitutional: She is oriented to person, place, and time. She appears well-developed and well-nourished.  Cardiovascular: Normal rate.  Pulmonary/Chest: Effort normal.  Musculoskeletal: Normal range of motion.  Neurological: She is oriented to person, place, and time.  Skin: Skin  is warm and dry.  Psychiatric: She has a normal mood and affect. Her behavior is normal.  Vitals reviewed.   RECENT LABS AND TESTS: BMET    Component Value Date/Time   NA 144 01/28/2018 1147   K 4.8 01/28/2018 1147   CL 105 01/28/2018 1147   CO2 25 01/28/2018 1147   GLUCOSE 97 01/28/2018 1147   GLUCOSE 90 05/30/2017 1510   BUN 12 01/28/2018 1147   CREATININE 0.88 01/28/2018 1147   CALCIUM 9.8 01/28/2018 1147   GFRNONAA 75 01/28/2018 1147   GFRAA 86 01/28/2018 1147   Lab Results  Component Value Date   HGBA1C 5.9 (H) 01/28/2018   HGBA1C 6.2 05/30/2017   HGBA1C 6.2 05/29/2016   HGBA1C 5.8 02/23/2015   Lab Results  Component Value Date   INSULIN 20.4 01/28/2018   CBC    Component Value Date/Time   WBC 4.3 01/28/2018 1147   WBC 4.8 05/30/2017 1510   RBC 4.57 01/28/2018 1147   RBC 4.40 05/30/2017 1510   HGB 12.2 01/28/2018 1147   HCT 39.4 01/28/2018 1147   PLT 310.0 05/30/2017 1510   MCV 86 01/28/2018 1147   MCH 26.7 01/28/2018 1147   MCHC 31.0 (L) 01/28/2018 1147   MCHC 31.8 05/30/2017 1510   RDW 14.9 01/28/2018 1147   LYMPHSABS 2.0 01/28/2018 1147   EOSABS 0.2 01/28/2018 1147   BASOSABS 0.1 01/28/2018 1147   Iron/TIBC/Ferritin/ %Sat No results found for: IRON, TIBC, FERRITIN, IRONPCTSAT Lipid Panel     Component Value Date/Time   CHOL 187 01/28/2018 1147   TRIG 99 01/28/2018 1147   HDL 73 01/28/2018 1147   CHOLHDL 3 05/30/2017 1510   VLDL 24.8 05/30/2017 1510   LDLCALC 94 01/28/2018 1147   Hepatic Function Panel     Component Value Date/Time   PROT 7.2 01/28/2018 1147   ALBUMIN 4.3 01/28/2018 1147   AST 17 01/28/2018 1147   ALT 16 01/28/2018 1147   ALKPHOS 87 01/28/2018 1147   BILITOT 0.4 01/28/2018 1147      Component Value Date/Time   TSH 0.888 01/28/2018 1147   TSH 0.79 02/23/2015 1512   Results for PEBBLE, BOTKIN (MRN [id number]) as of 04/29/2018 16:11  Ref. Range 01/28/2018 11:47  Vitamin D, 25-Hydroxy Latest Ref Range: 30.0 - 100.0  ng/mL 10.6 (L)   ASSESSMENT AND PLAN: Vitamin D deficiency  Prediabetes  Class 3 severe obesity with serious comorbidity and body mass index (BMI) of 50.0 to 59.9 in adult, unspecified obesity type ([facility])  PLAN:  Vitamin D Deficiency Tiyah was informed that low vitamin D levels contributes to fatigue and are associated with obesity, breast, and colon cancer. She agrees to continue  to take prescription Vit D @50 ,000 IU every week and will follow up for routine testing of vitamin D, at least 2-3 times per year. She was informed of the risk of over-replacement of vitamin D and agrees to not increase her dose unless she discusses this with [place] first. We will recheck labs next month.  Pre-Diabetes Ezma will continue to work on weight loss, exercise, and decreasing simple carbohydrates in her diet to help decrease the risk of diabetes. We dicussed metformin including benefits and risks. She was informed that eating too many simple carbohydrates or too many calories at one sitting increases the likelihood of GI side effects. Baljit will continue metformin for now and a prescription was not written today. We will check labs next month. Kauri agreed to follow up with [place] as directed to monitor her progress.  We spent > than 50% of the 30 minute visit on the counseling as documented in the note.  Obesity Melaina is currently in the action stage of change. As such, her goal is to continue with weight loss efforts She has agreed to change to a lower carbohydrate, vegetable and lean protein rich diet plan Hailie has been instructed to work up to a goal of 150 minutes of combined cardio and strengthening exercise per week for weight loss and overall health benefits. We discussed the following Behavioral Modification Strategies today: increasing lean protein intake and no skipping meals  Jazminn has agreed to follow up with our clinic in 2 weeks. She was informed of the importance of frequent follow  up visits to maximize her success with intensive lifestyle modifications for her multiple health conditions.   OBESITY BEHAVIORAL INTERVENTION VISIT  Today's visit was # 6 out of 22.  Starting weight: 340 lbs Starting date: 01/28/18 Today's weight : 325 lbs  Today's date: 04/29/2018 Total lbs lost to date: 15    ASK: We discussed the diagnosis of obesity with Albin Fischer today and Tekoa agreed to give [place] permission to discuss obesity behavioral modification therapy today.  ASSESS: Makalia has the diagnosis of obesity and her BMI today is 50.89 Kadin is in the action stage of change   ADVISE: Daleen was educated on the multiple health risks of obesity as well as the benefit of weight loss to improve her health. She was advised of the need for long term treatment and the importance of lifestyle modifications.  AGREE: Multiple dietary modification options and treatment options were discussed and  Jacquetta agreed to the above obesity treatment plan.  I, Doreene Nest, am acting as transcriptionist for Dennard Nip, MD  I have reviewed the above documentation for accuracy and completeness, and I agree with the above. -Dennard Nip, MD

## 2018-05-10 ENCOUNTER — Other Ambulatory Visit (INDEPENDENT_AMBULATORY_CARE_PROVIDER_SITE_OTHER): Payer: Self-pay | Admitting: Family Medicine

## 2018-05-10 DIAGNOSIS — E559 Vitamin D deficiency, unspecified: Secondary | ICD-10-CM

## 2018-05-13 ENCOUNTER — Ambulatory Visit (INDEPENDENT_AMBULATORY_CARE_PROVIDER_SITE_OTHER): Payer: BLUE CROSS/BLUE SHIELD | Admitting: Physician Assistant

## 2018-05-13 VITALS — BP 116/76 | HR 73 | Temp 98.1°F | Ht 67.0 in | Wt 318.0 lb

## 2018-05-13 DIAGNOSIS — Z6841 Body Mass Index (BMI) 40.0 and over, adult: Secondary | ICD-10-CM | POA: Diagnosis not present

## 2018-05-13 DIAGNOSIS — E559 Vitamin D deficiency, unspecified: Secondary | ICD-10-CM

## 2018-05-14 NOTE — Progress Notes (Signed)
Office: [phone]  /  Fax: [phone]   HPI:   Chief Complaint: OBESITY Wendy Wagner is here to discuss her progress with her obesity treatment plan. She is on the lower carbohydrate, vegetable and lean protein rich diet plan and is following her eating plan approximately 65-75 % of the time. She states she is exercising 0 minutes 0 times per week. Wendy Wagner is doing very well with weight loss on low carbohydrate plan. She reports missing bread and fruit but would like to continue with same plan.  Her weight is (!) 318 lb (144.2 kg) today and has had a weight loss of 7 pounds over a period of 2 weeks since her last visit. She has lost 22 lbs since starting treatment with [place].  Vitamin D Deficiency Wendy Wagner has a diagnosis of vitamin D deficiency. She is on prescription Vit D and denies nausea, vomiting or muscle weakness.  ALLERGIES: Allergies  Allergen Reactions  . No Known Drug Allergy     MEDICATIONS: Current Outpatient Medications on File Prior to Visit  Medication Sig Dispense Refill  . benzonatate (TESSALON) 100 MG capsule Take 1-2 capsules (100-200 mg total) by mouth 3 (three) times daily as needed. 20 capsule 0  . ibuprofen (ADVIL,MOTRIN) 800 MG tablet Take 1 tablet (800 mg total) by mouth every 8 (eight) hours as needed. 90 tablet 0  . metFORMIN (GLUCOPHAGE) 500 MG tablet Take 1 tablet (500 mg total) by mouth daily with breakfast. 30 tablet 0  . naproxen (NAPROSYN) 500 MG tablet TAKE 1 TABLET BY MOUTH TWICE A DAY WITH FOOD 30 tablet 0  . traMADol (ULTRAM) 50 MG tablet Take 50 mg by mouth every 6 (six) hours as needed.   0  . triamcinolone cream (KENALOG) 0.1 % Apply 1 application topically 2 (two) times daily. 30 g 0  . Vitamin D, Ergocalciferol, (DRISDOL) 50000 units CAPS capsule Take 1 capsule (50,000 Units total) by mouth every 7 (seven) days. 4 capsule 0   No current facility-administered medications on file prior to visit.     PAST MEDICAL HISTORY: Past Medical  History:  Diagnosis Date  . Anemia   . Back pain   . Childhood asthma   . GERD (gastroesophageal reflux disease)   . Heart murmur   . Leg edema     PAST SURGICAL HISTORY: Past Surgical History:  Procedure Laterality Date  . CESAREAN SECTION    . WISDOM TOOTH EXTRACTION      SOCIAL HISTORY: Social History   Tobacco Use  . Smoking status: Former Smoker    Packs/day: 1.00    Years: 8.00    Pack years: 8.00    Types: Cigarettes  . Smokeless tobacco: Never Used  Substance Use Topics  . Alcohol use: No    Alcohol/week: 0.0 standard drinks  . Drug use: No    FAMILY HISTORY: Family History  Problem Relation Age of Onset  . Cancer Mother        brain  . Cancer Father        brain, lung  . Alcoholism Father   . Diabetes Neg Hx   . Heart disease Neg Hx   . Stroke Neg Hx     ROS: Review of Systems  Constitutional: Positive for weight loss.  Gastrointestinal: Negative for nausea and vomiting.  Musculoskeletal:       Negative muscle weakness    PHYSICAL EXAM: Blood pressure 116/76, pulse 73, temperature 98.1 F (36.7 C), temperature source Oral, height 5\' 7"  (1.702 m),  weight (!) 318 lb (144.2 kg), SpO2 96 %. Body mass index is 49.81 kg/m. Physical Exam  Constitutional: She is oriented to person, place, and time. She appears well-developed and well-nourished.  Cardiovascular: Normal rate.  Pulmonary/Chest: Effort normal.  Musculoskeletal: Normal range of motion.  Neurological: She is oriented to person, place, and time.  Skin: Skin is warm and dry.  Psychiatric: She has a normal mood and affect. Her behavior is normal.  Vitals reviewed.   RECENT LABS AND TESTS: BMET    Component Value Date/Time   NA 144 01/28/2018 1147   K 4.8 01/28/2018 1147   CL 105 01/28/2018 1147   CO2 25 01/28/2018 1147   GLUCOSE 97 01/28/2018 1147   GLUCOSE 90 05/30/2017 1510   BUN 12 01/28/2018 1147   CREATININE 0.88 01/28/2018 1147   CALCIUM 9.8 01/28/2018 1147   GFRNONAA  75 01/28/2018 1147   GFRAA 86 01/28/2018 1147   Lab Results  Component Value Date   HGBA1C 5.9 (H) 01/28/2018   HGBA1C 6.2 05/30/2017   HGBA1C 6.2 05/29/2016   HGBA1C 5.8 02/23/2015   Lab Results  Component Value Date   INSULIN 20.4 01/28/2018   CBC    Component Value Date/Time   WBC 4.3 01/28/2018 1147   WBC 4.8 05/30/2017 1510   RBC 4.57 01/28/2018 1147   RBC 4.40 05/30/2017 1510   HGB 12.2 01/28/2018 1147   HCT 39.4 01/28/2018 1147   PLT 310.0 05/30/2017 1510   MCV 86 01/28/2018 1147   MCH 26.7 01/28/2018 1147   MCHC 31.0 (L) 01/28/2018 1147   MCHC 31.8 05/30/2017 1510   RDW 14.9 01/28/2018 1147   LYMPHSABS 2.0 01/28/2018 1147   EOSABS 0.2 01/28/2018 1147   BASOSABS 0.1 01/28/2018 1147   Iron/TIBC/Ferritin/ %Sat No results found for: IRON, TIBC, FERRITIN, IRONPCTSAT Lipid Panel     Component Value Date/Time   CHOL 187 01/28/2018 1147   TRIG 99 01/28/2018 1147   HDL 73 01/28/2018 1147   CHOLHDL 3 05/30/2017 1510   VLDL 24.8 05/30/2017 1510   LDLCALC 94 01/28/2018 1147   Hepatic Function Panel     Component Value Date/Time   PROT 7.2 01/28/2018 1147   ALBUMIN 4.3 01/28/2018 1147   AST 17 01/28/2018 1147   ALT 16 01/28/2018 1147   ALKPHOS 87 01/28/2018 1147   BILITOT 0.4 01/28/2018 1147      Component Value Date/Time   TSH 0.888 01/28/2018 1147   TSH 0.79 02/23/2015 1512  Results for Wendy, Wagner (MRN [id number]) as of 05/14/2018 11:39  Ref. Range 01/28/2018 11:47  Vitamin D, 25-Hydroxy Latest Ref Range: 30.0 - 100.0 ng/mL 10.6 (L)    ASSESSMENT AND PLAN: Vitamin D deficiency  Class 3 severe obesity with serious comorbidity and body mass index (BMI) of 45.0 to 49.9 in adult, unspecified obesity type (HCC)  PLAN:  Vitamin D Deficiency Wendy Wagner was informed that low vitamin D levels contributes to fatigue and are associated with obesity, breast, and colon cancer. Wendy Wagner agrees to continue taking prescription Vit D @50 ,000 IU every week and will  follow up for routine testing of vitamin D, at least 2-3 times per year. She was informed of the risk of over-replacement of vitamin D and agrees to not increase her dose unless she discusses this with [place] first. Wendy Wagner agrees to follow up with our clinic in 2 weeks and we will check labs at that time.  We spent > than 50% of the 15 minute visit on the counseling  as documented in the note.  Obesity Wendy Wagner is currently in the action stage of change. As such, her goal is to continue with weight loss efforts She has agreed to follow a lower carbohydrate, vegetable and lean protein rich diet plan Wendy Wagner has been instructed to work up to a goal of 150 minutes of combined cardio and strengthening exercise per week for weight loss and overall health benefits. We discussed the following Behavioral Modification Strategies today: work on meal planning and easy cooking plans and ways to avoid boredom eating Advised Wendy Wagner that she can add one piece of fruit daily  Wendy Wagner has agreed to follow up with our clinic in 2 weeks. She was informed of the importance of frequent follow up visits to maximize her success with intensive lifestyle modifications for her multiple health conditions.   OBESITY BEHAVIORAL INTERVENTION VISIT  Today's visit was # 7 out of 22.  Starting weight: 340 lbs Starting date: 01/28/18 Today's weight : 318 lbs  Today's date: 05/13/2018 Total lbs lost to date: 33    ASK: We discussed the diagnosis of obesity with Wendy Wagner today and Wendy Wagner agreed to give [place] permission to discuss obesity behavioral modification therapy today.  ASSESS: Wendy Wagner has the diagnosis of obesity and her BMI today is 49.79 Wendy Wagner is in the action stage of change   ADVISE: Wendy Wagner was educated on the multiple health risks of obesity as well as the benefit of weight loss to improve her health. She was advised of the need for long term treatment and the importance of lifestyle  modifications.  AGREE: Multiple dietary modification options and treatment options were discussed and  Wendy Wagner agreed to the above obesity treatment plan.  Wendy Wagner, am acting as transcriptionist for Abby Potash, PA-C I, Abby Potash, PA-C have reviewed above note and agree with its content

## 2018-06-04 ENCOUNTER — Ambulatory Visit (INDEPENDENT_AMBULATORY_CARE_PROVIDER_SITE_OTHER): Payer: BLUE CROSS/BLUE SHIELD | Admitting: Physician Assistant

## 2018-06-04 VITALS — BP 114/72 | HR 67 | Temp 98.9°F | Ht 67.0 in | Wt 314.0 lb

## 2018-06-04 DIAGNOSIS — R7303 Prediabetes: Secondary | ICD-10-CM | POA: Diagnosis not present

## 2018-06-04 DIAGNOSIS — E559 Vitamin D deficiency, unspecified: Secondary | ICD-10-CM | POA: Diagnosis not present

## 2018-06-04 DIAGNOSIS — E7849 Other hyperlipidemia: Secondary | ICD-10-CM

## 2018-06-04 DIAGNOSIS — Z9189 Other specified personal risk factors, not elsewhere classified: Secondary | ICD-10-CM

## 2018-06-04 DIAGNOSIS — Z6841 Body Mass Index (BMI) 40.0 and over, adult: Secondary | ICD-10-CM

## 2018-06-04 NOTE — Progress Notes (Signed)
Office: [phone]  /  Fax: [phone]   HPI:   Chief Complaint: OBESITY Wendy Wagner is here to discuss her progress with her obesity treatment plan. She is on the lower carbohydrate, vegetable and lean protein rich diet plan and is following her eating plan approximately 90 % of the time. She states she is exercising 0 minutes 0 times per week. Wendy Wagner continues to do well with weight loss. She states that she cheats intermittently but portion controls those things. She also reports that she doesn't always eat dinner if she has a later lunch.  Her weight is (!) 314 lb (142.4 kg) today and has had a weight loss of 4 pounds over a period of 3 weeks since her last visit. She has lost 26 lbs since starting treatment with [place].  Vitamin D Deficiency Wendy Wagner has a diagnosis of vitamin D deficiency. She is on prescription Vit D and denies nausea, vomiting or muscle weakness.  At risk for osteopenia and osteoporosis Wendy Wagner is at higher risk of osteopenia and osteoporosis due to vitamin D deficiency.   Pre-Diabetes Wendy Wagner has a diagnosis of pre-diabetes based on her elevated Hgb A1c and was informed this puts her at greater risk of developing diabetes. She denies polyphagia or hypoglycemia. She reports that she has not taken metformin in about 2 weeks, as she ran out of prescription. She continues to work on diet and exercise to decrease risk of diabetes.   Hyperlipidemia Wendy Wagner has hyperlipidemia and has been trying to improve her cholesterol levels with intensive lifestyle modification including a low saturated fat diet, exercise and weight loss. She is not on medications and she denies any chest pain, claudication or myalgias.  ALLERGIES: Allergies  Allergen Reactions  . No Known Drug Allergy     MEDICATIONS: Current Outpatient Medications on File Prior to Visit  Medication Sig Dispense Refill  . benzonatate (TESSALON) 100 MG capsule Take 1-2 capsules (100-200 mg total) by mouth 3  (three) times daily as needed. 20 capsule 0  . ibuprofen (ADVIL,MOTRIN) 800 MG tablet Take 1 tablet (800 mg total) by mouth every 8 (eight) hours as needed. 90 tablet 0  . metFORMIN (GLUCOPHAGE) 500 MG tablet Take 1 tablet (500 mg total) by mouth daily with breakfast. 30 tablet 0  . naproxen (NAPROSYN) 500 MG tablet TAKE 1 TABLET BY MOUTH TWICE A DAY WITH FOOD 30 tablet 0  . traMADol (ULTRAM) 50 MG tablet Take 50 mg by mouth every 6 (six) hours as needed.   0  . triamcinolone cream (KENALOG) 0.1 % Apply 1 application topically 2 (two) times daily. 30 g 0  . Vitamin D, Ergocalciferol, (DRISDOL) 50000 units CAPS capsule Take 1 capsule (50,000 Units total) by mouth every 7 (seven) days. 4 capsule 0   No current facility-administered medications on file prior to visit.     PAST MEDICAL HISTORY: Past Medical History:  Diagnosis Date  . Anemia   . Back pain   . Childhood asthma   . GERD (gastroesophageal reflux disease)   . Heart murmur   . Leg edema     PAST SURGICAL HISTORY: Past Surgical History:  Procedure Laterality Date  . CESAREAN SECTION    . WISDOM TOOTH EXTRACTION      SOCIAL HISTORY: Social History   Tobacco Use  . Smoking status: Former Smoker    Packs/day: 1.00    Years: 8.00    Pack years: 8.00    Types: Cigarettes  . Smokeless tobacco: Never Used  Substance  Use Topics  . Alcohol use: No    Alcohol/week: 0.0 standard drinks  . Drug use: No    FAMILY HISTORY: Family History  Problem Relation Age of Onset  . Cancer Mother        brain  . Cancer Father        brain, lung  . Alcoholism Father   . Diabetes Neg Hx   . Heart disease Neg Hx   . Stroke Neg Hx     ROS: Review of Systems  Constitutional: Positive for weight loss.  Cardiovascular: Negative for chest pain and claudication.  Gastrointestinal: Negative for nausea and vomiting.  Musculoskeletal: Negative for myalgias.       Negative muscle weakness  Endo/Heme/Allergies:       Negative  polyphagia Negative hypoglycemia    PHYSICAL EXAM: Blood pressure 114/72, pulse 67, temperature 98.9 F (37.2 C), temperature source Oral, height 5\' 7"  (1.702 m), weight (!) 314 lb (142.4 kg), SpO2 97 %. Body mass index is 49.18 kg/m. Physical Exam  Constitutional: She is oriented to person, place, and time. She appears well-developed and well-nourished.  Cardiovascular: Normal rate.  Pulmonary/Chest: Effort normal.  Musculoskeletal: Normal range of motion.  Neurological: She is oriented to person, place, and time.  Skin: Skin is warm and dry.  Psychiatric: She has a normal mood and affect. Her behavior is normal.  Vitals reviewed.   RECENT LABS AND TESTS: BMET    Component Value Date/Time   NA 144 01/28/2018 1147   K 4.8 01/28/2018 1147   CL 105 01/28/2018 1147   CO2 25 01/28/2018 1147   GLUCOSE 97 01/28/2018 1147   GLUCOSE 90 05/30/2017 1510   BUN 12 01/28/2018 1147   CREATININE 0.88 01/28/2018 1147   CALCIUM 9.8 01/28/2018 1147   GFRNONAA 75 01/28/2018 1147   GFRAA 86 01/28/2018 1147   Lab Results  Component Value Date   HGBA1C 5.9 (H) 01/28/2018   HGBA1C 6.2 05/30/2017   HGBA1C 6.2 05/29/2016   HGBA1C 5.8 02/23/2015   Lab Results  Component Value Date   INSULIN 20.4 01/28/2018   CBC    Component Value Date/Time   WBC 4.3 01/28/2018 1147   WBC 4.8 05/30/2017 1510   RBC 4.57 01/28/2018 1147   RBC 4.40 05/30/2017 1510   HGB 12.2 01/28/2018 1147   HCT 39.4 01/28/2018 1147   PLT 310.0 05/30/2017 1510   MCV 86 01/28/2018 1147   MCH 26.7 01/28/2018 1147   MCHC 31.0 (L) 01/28/2018 1147   MCHC 31.8 05/30/2017 1510   RDW 14.9 01/28/2018 1147   LYMPHSABS 2.0 01/28/2018 1147   EOSABS 0.2 01/28/2018 1147   BASOSABS 0.1 01/28/2018 1147   Iron/TIBC/Ferritin/ %Sat No results found for: IRON, TIBC, FERRITIN, IRONPCTSAT Lipid Panel     Component Value Date/Time   CHOL 187 01/28/2018 1147   TRIG 99 01/28/2018 1147   HDL 73 01/28/2018 1147   CHOLHDL 3  05/30/2017 1510   VLDL 24.8 05/30/2017 1510   LDLCALC 94 01/28/2018 1147   Hepatic Function Panel     Component Value Date/Time   PROT 7.2 01/28/2018 1147   ALBUMIN 4.3 01/28/2018 1147   AST 17 01/28/2018 1147   ALT 16 01/28/2018 1147   ALKPHOS 87 01/28/2018 1147   BILITOT 0.4 01/28/2018 1147      Component Value Date/Time   TSH 0.888 01/28/2018 1147   TSH 0.79 02/23/2015 1512  Results for EMIE, SOMMERFELD (MRN [id number]) as of 06/04/2018 14:47  Ref. Range 01/28/2018  11:47  Vitamin D, 25-Hydroxy Latest Ref Range: 30.0 - 100.0 ng/mL 10.6 (L)    ASSESSMENT AND PLAN: Vitamin D deficiency - Plan: VITAMIN D 25 Hydroxy (Vit-D Deficiency, Fractures)  Prediabetes - Plan: Comprehensive metabolic panel, Hemoglobin A1c, Insulin, random  At risk for osteoporosis  Other hyperlipidemia - Plan: Lipid Panel With LDL/HDL Ratio  Class 3 severe obesity with serious comorbidity and body mass index (BMI) of 45.0 to 49.9 in adult, unspecified obesity type (HCC)  PLAN:  Vitamin D Deficiency Wendy Wagner was informed that low vitamin D levels contributes to fatigue and are associated with obesity, breast, and colon cancer. Wendy Wagner agrees to continue to take prescription Vit D @50 ,000 IU every week and will follow up for routine testing of vitamin D, at least 2-3 times per year. She was informed of the risk of over-replacement of vitamin D and agrees to not increase her dose unless she discusses this with [place] first. We will check labs and Wendy Wagner agrees to follow up with our clinic in 2 to 3 weeks.  At risk for osteopenia and osteoporosis Wendy Wagner is at risk for osteopenia and osteoporsis due to her vitamin D deficiency. She was encouraged to take her vitamin D and follow her higher calcium diet and increase strengthening exercise to help strengthen her bones and decrease her risk of osteopenia and osteoporosis.  Pre-Diabetes Wendy Wagner will continue to work on weight loss, diet, exercise, and decreasing simple  carbohydrates in her diet to help decrease the risk of diabetes. We dicussed metformin including benefits and risks. She was informed that eating too many simple carbohydrates or too many calories at one sitting increases the likelihood of GI side effects. We will check labs and Wendy Wagner agrees to follow up with our clinic in 2 to 3 weeks as directed to monitor her progress.  Hyperlipidemia Wendy Wagner was informed of the American Heart Association Guidelines emphasizing intensive lifestyle modifications as the first line treatment for hyperlipidemia. We discussed many lifestyle modifications today in depth, and Wendy Wagner will continue to work on decreasing saturated fats such as fatty red meat, butter and many fried foods. She will also increase vegetables and lean protein in her diet and continue to work on diet, exercise, and weight loss efforts. We will check labs and Wendy Wagner agrees to follow up with our clinic in 2 to 3 weeks.  Obesity Wendy Wagner is currently in the action stage of change. As such, her goal is to continue with weight loss efforts She has agreed to follow a lower carbohydrate, vegetable and lean protein rich diet plan Wendy Wagner has been instructed to work up to a goal of 150 minutes of combined cardio and strengthening exercise per week for weight loss and overall health benefits. We discussed the following Behavioral Modification Strategies today: increasing lean protein intake and no skipping meals   Wendy Wagner has agreed to follow up with our clinic in 2 to 3 weeks. She was informed of the importance of frequent follow up visits to maximize her success with intensive lifestyle modifications for her multiple health conditions.   OBESITY BEHAVIORAL INTERVENTION VISIT  Today's visit was # 8.  Starting weight: 340 lbs Starting date: 01/28/18 Today's weight : 314 lbs  Today's date: 06/04/2018 Total lbs lost to date: 41    ASK: We discussed the diagnosis of obesity with Wendy Wagner  today and Wendy Wagner agreed to give [place] permission to discuss obesity behavioral modification therapy today.  ASSESS: Wendy Wagner has the diagnosis of obesity and her BMI today is  49.17 Wendy Wagner is in the action stage of change   ADVISE: Wendy Wagner was educated on the multiple health risks of obesity as well as the benefit of weight loss to improve her health. She was advised of the need for long term treatment and the importance of lifestyle modifications.  AGREE: Multiple dietary modification options and treatment options were discussed and  Wendy Wagner agreed to the above obesity treatment plan.  Wilhemena Durie, am acting as transcriptionist for Abby Potash, PA-C I, Abby Potash, PA-C have reviewed above note and agree with its content

## 2018-06-05 LAB — COMPREHENSIVE METABOLIC PANEL
A/G RATIO: 1.4 (ref 1.2–2.2)
ALBUMIN: 4 g/dL (ref 3.5–5.5)
ALK PHOS: 77 IU/L (ref 39–117)
ALT: 12 IU/L (ref 0–32)
AST: 11 IU/L (ref 0–40)
BILIRUBIN TOTAL: 0.3 mg/dL (ref 0.0–1.2)
BUN / CREAT RATIO: 12 (ref 9–23)
BUN: 11 mg/dL (ref 6–24)
CHLORIDE: 104 mmol/L (ref 96–106)
CO2: 22 mmol/L (ref 20–29)
CREATININE: 0.89 mg/dL (ref 0.57–1.00)
Calcium: 9.1 mg/dL (ref 8.7–10.2)
GFR calc Af Amer: 85 mL/min/{1.73_m2} (ref >59)
GFR calc non Af Amer: 74 mL/min/{1.73_m2} (ref >59)
GLOBULIN, TOTAL: 2.8 g/dL (ref 1.5–4.5)
Glucose: 95 mg/dL (ref 65–99)
POTASSIUM: 4.5 mmol/L (ref 3.5–5.2)
SODIUM: 143 mmol/L (ref 134–144)
Total Protein: 6.8 g/dL (ref 6.0–8.5)

## 2018-06-05 LAB — LIPID PANEL WITH LDL/HDL RATIO
Cholesterol, Total: 174 mg/dL (ref 100–199)
HDL: 67 mg/dL (ref >39)
LDL CALC: 89 mg/dL (ref 0–99)
LDL/HDL RATIO: 1.3 ratio (ref 0.0–3.2)
TRIGLYCERIDES: 91 mg/dL (ref 0–149)
VLDL Cholesterol Cal: 18 mg/dL (ref 5–40)

## 2018-06-05 LAB — HEMOGLOBIN A1C
Est. average glucose Bld gHb Est-mCnc: 120 mg/dL
Hgb A1c MFr Bld: 5.8 % — ABNORMAL HIGH (ref 4.8–5.6)

## 2018-06-05 LAB — VITAMIN D 25 HYDROXY (VIT D DEFICIENCY, FRACTURES): Vit D, 25-Hydroxy: 26.3 ng/mL — ABNORMAL LOW (ref 30.0–100.0)

## 2018-06-05 LAB — INSULIN, RANDOM: INSULIN: 21.9 u[IU]/mL (ref 2.6–24.9)

## 2018-06-18 ENCOUNTER — Ambulatory Visit (INDEPENDENT_AMBULATORY_CARE_PROVIDER_SITE_OTHER): Payer: BLUE CROSS/BLUE SHIELD | Admitting: Physician Assistant

## 2018-06-18 VITALS — BP 103/68 | HR 62 | Temp 98.5°F | Ht 67.0 in | Wt 320.0 lb

## 2018-06-18 DIAGNOSIS — E559 Vitamin D deficiency, unspecified: Secondary | ICD-10-CM | POA: Diagnosis not present

## 2018-06-18 DIAGNOSIS — R7303 Prediabetes: Secondary | ICD-10-CM

## 2018-06-18 DIAGNOSIS — Z9189 Other specified personal risk factors, not elsewhere classified: Secondary | ICD-10-CM

## 2018-06-18 DIAGNOSIS — Z6841 Body Mass Index (BMI) 40.0 and over, adult: Secondary | ICD-10-CM

## 2018-06-18 MED ORDER — METFORMIN HCL 500 MG PO TABS: 500.0000 mg | ORAL_TABLET | Freq: Every day | ORAL | 0 refills | Status: DC

## 2018-06-18 MED ORDER — VITAMIN D (ERGOCALCIFEROL) 1.25 MG (50000 UNIT) PO CAPS: 50000.0000 [IU] | ORAL_CAPSULE | ORAL | 0 refills | Status: DC

## 2018-06-22 NOTE — Progress Notes (Signed)
Office: [phone]  /  Fax: [phone]   HPI:   Chief Complaint: OBESITY Wendy Wagner is here to discuss her progress with her obesity treatment plan. She is on the lower carbohydrate, vegetable and lean protein rich diet plan and is following her eating plan approximately 85-90 % of the time. She states she is exercising 0 minutes 0 times per week. Wendy Wagner struggled to follow the plan over the last few weeks. She reports doing more snacking than normal. She would like to switch plans.  Her weight is (!) 320 lb (145.2 kg) today and has gained 6 pounds since her last visit. She has lost 20 lbs since starting treatment with [place].  Pre-Diabetes Wendy Wagner has a diagnosis of pre-diabetes based on her elevated Hgb A1c and was informed this puts her at greater risk of developing diabetes. She is taking metformin currently and continues to work on diet and exercise to decrease risk of diabetes. She denies polyphagia or hypoglycemia.  At risk for diabetes Wendy Wagner is at higher than average risk for developing diabetes due to her obesity and pre-diabetes. She currently denies polyuria or polydipsia.  Vitamin D Deficiency Wendy Wagner has a diagnosis of vitamin D deficiency. She is currently taking prescription Vit D and denies nausea, vomiting or muscle weakness.  ALLERGIES: Allergies  Allergen Reactions  . No Known Drug Allergy     MEDICATIONS: Current Outpatient Medications on File Prior to Visit  Medication Sig Dispense Refill  . benzonatate (TESSALON) 100 MG capsule Take 1-2 capsules (100-200 mg total) by mouth 3 (three) times daily as needed. 20 capsule 0  . ibuprofen (ADVIL,MOTRIN) 800 MG tablet Take 1 tablet (800 mg total) by mouth every 8 (eight) hours as needed. 90 tablet 0  . naproxen (NAPROSYN) 500 MG tablet TAKE 1 TABLET BY MOUTH TWICE A DAY WITH FOOD 30 tablet 0  . traMADol (ULTRAM) 50 MG tablet Take 50 mg by mouth every 6 (six) hours as needed.   0  . triamcinolone cream (KENALOG) 0.1 %  Apply 1 application topically 2 (two) times daily. 30 g 0   No current facility-administered medications on file prior to visit.     PAST MEDICAL HISTORY: Past Medical History:  Diagnosis Date  . Anemia   . Back pain   . Childhood asthma   . GERD (gastroesophageal reflux disease)   . Heart murmur   . Leg edema     PAST SURGICAL HISTORY: Past Surgical History:  Procedure Laterality Date  . CESAREAN SECTION    . WISDOM TOOTH EXTRACTION      SOCIAL HISTORY: Social History   Tobacco Use  . Smoking status: Former Smoker    Packs/day: 1.00    Years: 8.00    Pack years: 8.00    Types: Cigarettes  . Smokeless tobacco: Never Used  Substance Use Topics  . Alcohol use: No    Alcohol/week: 0.0 standard drinks  . Drug use: No    FAMILY HISTORY: Family History  Problem Relation Age of Onset  . Cancer Mother        brain  . Cancer Father        brain, lung  . Alcoholism Father   . Diabetes Neg Hx   . Heart disease Neg Hx   . Stroke Neg Hx     ROS: Review of Systems  Constitutional: Negative for weight loss.  Gastrointestinal: Negative for nausea and vomiting.  Genitourinary: Negative for frequency.  Musculoskeletal:       Negative muscle  weakness  Endo/Heme/Allergies: Negative for polydipsia.       Negative polyphagia Negative hypoglycemia    PHYSICAL EXAM: Blood pressure 103/68, pulse 62, temperature 98.5 F (36.9 C), temperature source Oral, height 5\' 7"  (1.702 m), weight (!) 320 lb (145.2 kg), last menstrual period 05/22/2016, SpO2 97 %. Body mass index is 50.12 kg/m. Physical Exam  Constitutional: She is oriented to person, place, and time. She appears well-developed and well-nourished.  Cardiovascular: Normal rate.  Pulmonary/Chest: Effort normal.  Musculoskeletal: Normal range of motion.  Neurological: She is oriented to person, place, and time.  Skin: Skin is warm and dry.  Psychiatric: She has a normal mood and affect. Her behavior is normal.    Vitals reviewed.   RECENT LABS AND TESTS: BMET    Component Value Date/Time   NA 143 06/04/2018 0938   K 4.5 06/04/2018 0938   CL 104 06/04/2018 0938   CO2 22 06/04/2018 0938   GLUCOSE 95 06/04/2018 0938   GLUCOSE 90 05/30/2017 1510   BUN 11 06/04/2018 0938   CREATININE 0.89 06/04/2018 0938   CALCIUM 9.1 06/04/2018 0938   GFRNONAA 74 06/04/2018 0938   GFRAA 85 06/04/2018 0938   Lab Results  Component Value Date   HGBA1C 5.8 (H) 06/04/2018   HGBA1C 5.9 (H) 01/28/2018   HGBA1C 6.2 05/30/2017   HGBA1C 6.2 05/29/2016   HGBA1C 5.8 02/23/2015   Lab Results  Component Value Date   INSULIN 21.9 06/04/2018   INSULIN 20.4 01/28/2018   CBC    Component Value Date/Time   WBC 4.3 01/28/2018 1147   WBC 4.8 05/30/2017 1510   RBC 4.57 01/28/2018 1147   RBC 4.40 05/30/2017 1510   HGB 12.2 01/28/2018 1147   HCT 39.4 01/28/2018 1147   PLT 310.0 05/30/2017 1510   MCV 86 01/28/2018 1147   MCH 26.7 01/28/2018 1147   MCHC 31.0 (L) 01/28/2018 1147   MCHC 31.8 05/30/2017 1510   RDW 14.9 01/28/2018 1147   LYMPHSABS 2.0 01/28/2018 1147   EOSABS 0.2 01/28/2018 1147   BASOSABS 0.1 01/28/2018 1147   Iron/TIBC/Ferritin/ %Sat No results found for: IRON, TIBC, FERRITIN, IRONPCTSAT Lipid Panel     Component Value Date/Time   CHOL 174 06/04/2018 0938   TRIG 91 06/04/2018 0938   HDL 67 06/04/2018 0938   CHOLHDL 3 05/30/2017 1510   VLDL 24.8 05/30/2017 1510   LDLCALC 89 06/04/2018 0938   Hepatic Function Panel     Component Value Date/Time   PROT 6.8 06/04/2018 0938   ALBUMIN 4.0 06/04/2018 0938   AST 11 06/04/2018 0938   ALT 12 06/04/2018 0938   ALKPHOS 77 06/04/2018 0938   BILITOT 0.3 06/04/2018 0938      Component Value Date/Time   TSH 0.888 01/28/2018 1147   TSH 0.79 02/23/2015 1512  Results for WINDI, TORO (MRN [id number]) as of 06/22/2018 12:30  Ref. Range 06/04/2018 09:38  Vitamin D, 25-Hydroxy Latest Ref Range: 30.0 - 100.0 ng/mL 26.3 (L)    ASSESSMENT AND  PLAN: Prediabetes - Plan: metFORMIN (GLUCOPHAGE) 500 MG tablet  Vitamin D deficiency - Plan: Vitamin D, Ergocalciferol, (DRISDOL) 50000 units CAPS capsule  At risk for diabetes mellitus  Class 3 severe obesity with serious comorbidity and body mass index (BMI) of 50.0 to 59.9 in adult, unspecified obesity type ([place])  PLAN:  Pre-Diabetes Wendy Wagner will continue to work on weight loss, exercise, and decreasing simple carbohydrates in her diet to help decrease the risk of diabetes. We dicussed metformin including benefits  and risks. She was informed that eating too many simple carbohydrates or too many calories at one sitting increases the likelihood of GI side effects. Wendy Wagner agrees to continue taking metformin 500 mg q AM #30 and we will refill for 1 month. Wendy Wagner agrees to follow up with our clinic in 3 weeks as directed to monitor her progress.  Diabetes risk counselling Wendy Wagner was given extended (15 minutes) diabetes prevention counseling today. She is 52 y.o. female and has risk factors for diabetes including obesity and pre-diabetes. We discussed intensive lifestyle modifications today with an emphasis on weight loss as well as increasing exercise and decreasing simple carbohydrates in her diet.  Vitamin D Deficiency Wendy Wagner was informed that low vitamin D levels contributes to fatigue and are associated with obesity, breast, and colon cancer. Wendy Wagner agrees to continue taking prescription Vit D @50 ,000 IU every week #4 and we will refill for 1 month. She will follow up for routine testing of vitamin D, at least 2-3 times per year. She was informed of the risk of over-replacement of vitamin D and agrees to not increase her dose unless she discusses this with [place] first. Wendy Wagner agrees to follow up with our clinic in 3 weeks  Obesity Wendy Wagner is currently in the action stage of change. As such, her goal is to continue with weight loss efforts She has agreed to follow the Category 3  plan Wendy Wagner has been instructed to work up to a goal of 150 minutes of combined cardio and strengthening exercise per week for weight loss and overall health benefits. We discussed the following Behavioral Modification Strategies today: work on meal planning and easy cooking plans, no skipping meals   Wendy Wagner has agreed to follow up with our clinic in 3 weeks. She was informed of the importance of frequent follow up visits to maximize her success with intensive lifestyle modifications for her multiple health conditions.   OBESITY BEHAVIORAL INTERVENTION VISIT  Today's visit was # 9  Starting weight: 340 lbs Starting date: 01/28/18 Today's weight : 320 lbs  Today's date: 06/18/2018 Total lbs lost to date: 80    ASK: We discussed the diagnosis of obesity with Wendy Wagner today and Wendy Wagner agreed to give [place] permission to discuss obesity behavioral modification therapy today.  ASSESS: Wendy Wagner has the diagnosis of obesity and her BMI today is 50.11 Wendy Wagner is in the action stage of change   ADVISE: Wendy Wagner was educated on the multiple health risks of obesity as well as the benefit of weight loss to improve her health. She was advised of the need for long term treatment and the importance of lifestyle modifications.  AGREE: Multiple dietary modification options and treatment options were discussed and  Aashika agreed to the above obesity treatment plan.  Wilhemena Durie, am acting as transcriptionist for Abby Potash, PA-C I, Abby Potash, PA-C have reviewed above note and agree with its content

## 2018-07-09 ENCOUNTER — Encounter (INDEPENDENT_AMBULATORY_CARE_PROVIDER_SITE_OTHER): Payer: Self-pay | Admitting: Physician Assistant

## 2018-07-09 ENCOUNTER — Ambulatory Visit (INDEPENDENT_AMBULATORY_CARE_PROVIDER_SITE_OTHER): Payer: BLUE CROSS/BLUE SHIELD | Admitting: Physician Assistant

## 2018-07-09 VITALS — BP 120/76 | HR 65 | Temp 98.2°F | Ht 67.0 in | Wt 314.0 lb

## 2018-07-09 DIAGNOSIS — R7303 Prediabetes: Secondary | ICD-10-CM | POA: Diagnosis not present

## 2018-07-09 DIAGNOSIS — Z9189 Other specified personal risk factors, not elsewhere classified: Secondary | ICD-10-CM | POA: Diagnosis not present

## 2018-07-09 DIAGNOSIS — E559 Vitamin D deficiency, unspecified: Secondary | ICD-10-CM

## 2018-07-09 DIAGNOSIS — Z6841 Body Mass Index (BMI) 40.0 and over, adult: Secondary | ICD-10-CM

## 2018-07-09 MED ORDER — VITAMIN D (ERGOCALCIFEROL) 1.25 MG (50000 UNIT) PO CAPS: 50000.0000 [IU] | ORAL_CAPSULE | ORAL | 0 refills | Status: DC

## 2018-07-13 NOTE — Progress Notes (Signed)
Office: [phone]  /  Fax: [phone]   HPI:   Chief Complaint: OBESITY Wendy Wagner is here to discuss her progress with her obesity treatment plan. She is on the Category 3 plan and is following her eating plan approximately 85 % of the time. She states she is exercising 0 minutes 0 times per week. Wendy Wagner did very well with weight loss. She reports that she continues to struggle getting all of her food in during the day, especially protein at dinner.  Her weight is (!) 314 lb (142.4 kg) today and has had a weight loss of 6 pounds over a period of 3 weeks since her last visit. She has lost 26 lbs since starting treatment with [place].  Pre-Diabetes Wendy Wagner has a diagnosis of pre-diabetes based on her elevated Hgb A1c and was informed this puts her at greater risk of developing diabetes. She is on metformin and denies nausea, vomiting, or diarrhea, and continues to work on diet and exercise to decrease risk of diabetes. She denies polyphagia or hypoglycemia.  At risk for diabetes Wendy Wagner is at higher than average risk for developing diabetes due to her obesity and pre-diabetes. She currently denies polyuria or polydipsia.  Vitamin D Deficiency Wendy Wagner has a diagnosis of vitamin D deficiency. She is currently taking prescription Vit D and denies nausea, vomiting or muscle weakness.  ALLERGIES: Allergies  Allergen Reactions  . No Known Drug Allergy     MEDICATIONS: Current Outpatient Medications on File Prior to Visit  Medication Sig Dispense Refill  . benzonatate (TESSALON) 100 MG capsule Take 1-2 capsules (100-200 mg total) by mouth 3 (three) times daily as needed. 20 capsule 0  . ibuprofen (ADVIL,MOTRIN) 800 MG tablet Take 1 tablet (800 mg total) by mouth every 8 (eight) hours as needed. 90 tablet 0  . metFORMIN (GLUCOPHAGE) 500 MG tablet Take 1 tablet (500 mg total) by mouth daily with breakfast. 30 tablet 0  . naproxen (NAPROSYN) 500 MG tablet TAKE 1 TABLET BY MOUTH TWICE A DAY WITH  FOOD 30 tablet 0  . traMADol (ULTRAM) 50 MG tablet Take 50 mg by mouth every 6 (six) hours as needed.   0  . triamcinolone cream (KENALOG) 0.1 % Apply 1 application topically 2 (two) times daily. 30 g 0   No current facility-administered medications on file prior to visit.     PAST MEDICAL HISTORY: Past Medical History:  Diagnosis Date  . Anemia   . Back pain   . Childhood asthma   . GERD (gastroesophageal reflux disease)   . Heart murmur   . Leg edema     PAST SURGICAL HISTORY: Past Surgical History:  Procedure Laterality Date  . CESAREAN SECTION    . WISDOM TOOTH EXTRACTION      SOCIAL HISTORY: Social History   Tobacco Use  . Smoking status: Former Smoker    Packs/day: 1.00    Years: 8.00    Pack years: 8.00    Types: Cigarettes  . Smokeless tobacco: Never Used  Substance Use Topics  . Alcohol use: No    Alcohol/week: 0.0 standard drinks  . Drug use: No    FAMILY HISTORY: Family History  Problem Relation Age of Onset  . Cancer Mother        brain  . Cancer Father        brain, lung  . Alcoholism Father   . Diabetes Neg Hx   . Heart disease Neg Hx   . Stroke Neg Hx  ROS: Review of Systems  Constitutional: Positive for weight loss.  Gastrointestinal: Negative for diarrhea, nausea and vomiting.  Genitourinary: Negative for frequency.  Musculoskeletal:       Negative muscle weakness  Endo/Heme/Allergies: Negative for polydipsia.       Negative polyphagia Negative hypoglycemia    PHYSICAL EXAM: Blood pressure 120/76, pulse 65, temperature 98.2 F (36.8 C), temperature source Oral, height 5\' 7"  (1.702 m), weight (!) 314 lb (142.4 kg), last menstrual period 05/22/2016, SpO2 99 %. Body mass index is 49.18 kg/m. Physical Exam  Constitutional: She is oriented to person, place, and time. She appears well-developed and well-nourished.  Cardiovascular: Normal rate.  Pulmonary/Chest: Effort normal.  Musculoskeletal: Normal range of motion.    Neurological: She is oriented to person, place, and time.  Skin: Skin is warm and dry.  Psychiatric: She has a normal mood and affect. Her behavior is normal.  Vitals reviewed.   RECENT LABS AND TESTS: BMET    Component Value Date/Time   NA 143 06/04/2018 0938   K 4.5 06/04/2018 0938   CL 104 06/04/2018 0938   CO2 22 06/04/2018 0938   GLUCOSE 95 06/04/2018 0938   GLUCOSE 90 05/30/2017 1510   BUN 11 06/04/2018 0938   CREATININE 0.89 06/04/2018 0938   CALCIUM 9.1 06/04/2018 0938   GFRNONAA 74 06/04/2018 0938   GFRAA 85 06/04/2018 0938   Lab Results  Component Value Date   HGBA1C 5.8 (H) 06/04/2018   HGBA1C 5.9 (H) 01/28/2018   HGBA1C 6.2 05/30/2017   HGBA1C 6.2 05/29/2016   HGBA1C 5.8 02/23/2015   Lab Results  Component Value Date   INSULIN 21.9 06/04/2018   INSULIN 20.4 01/28/2018   CBC    Component Value Date/Time   WBC 4.3 01/28/2018 1147   WBC 4.8 05/30/2017 1510   RBC 4.57 01/28/2018 1147   RBC 4.40 05/30/2017 1510   HGB 12.2 01/28/2018 1147   HCT 39.4 01/28/2018 1147   PLT 310.0 05/30/2017 1510   MCV 86 01/28/2018 1147   MCH 26.7 01/28/2018 1147   MCHC 31.0 (L) 01/28/2018 1147   MCHC 31.8 05/30/2017 1510   RDW 14.9 01/28/2018 1147   LYMPHSABS 2.0 01/28/2018 1147   EOSABS 0.2 01/28/2018 1147   BASOSABS 0.1 01/28/2018 1147   Iron/TIBC/Ferritin/ %Sat No results found for: IRON, TIBC, FERRITIN, IRONPCTSAT Lipid Panel     Component Value Date/Time   CHOL 174 06/04/2018 0938   TRIG 91 06/04/2018 0938   HDL 67 06/04/2018 0938   CHOLHDL 3 05/30/2017 1510   VLDL 24.8 05/30/2017 1510   LDLCALC 89 06/04/2018 0938   Hepatic Function Panel     Component Value Date/Time   PROT 6.8 06/04/2018 0938   ALBUMIN 4.0 06/04/2018 0938   AST 11 06/04/2018 0938   ALT 12 06/04/2018 0938   ALKPHOS 77 06/04/2018 0938   BILITOT 0.3 06/04/2018 0938      Component Value Date/Time   TSH 0.888 01/28/2018 1147   TSH 0.79 02/23/2015 1512  Results for Wendy, Wagner  (MRN [id number]) as of 07/13/2018 08:45  Ref. Range 06/04/2018 09:38  Vitamin D, 25-Hydroxy Latest Ref Range: 30.0 - 100.0 ng/mL 26.3 (L)    ASSESSMENT AND PLAN: Vitamin D deficiency - Plan: Vitamin D, Ergocalciferol, (DRISDOL) 50000 units CAPS capsule  Prediabetes  At risk for diabetes mellitus  Class 3 severe obesity with serious comorbidity and body mass index (BMI) of 45.0 to 49.9 in adult, unspecified obesity type ([place])  PLAN:  Pre-Diabetes Wendy Wagner will  continue to work on weight loss, diet, exercise, and decreasing simple carbohydrates in her diet to help decrease the risk of diabetes. We dicussed metformin including benefits and risks. She was informed that eating too many simple carbohydrates or too many calories at one sitting increases the likelihood of GI side effects. Wendy Wagner agrees to continue taking metformin and she agrees to follow up with our clinic in 3 weeks as directed to monitor her progress.  Diabetes risk counselling Wendy Wagner was given extended (15 minutes) diabetes prevention counseling today. She is 52 y.o. female and has risk factors for diabetes including obesity and pre-diabetes. We discussed intensive lifestyle modifications today with an emphasis on weight loss as well as increasing exercise and decreasing simple carbohydrates in her diet.  Vitamin D Deficiency Wendy Wagner was informed that low vitamin D levels contributes to fatigue and are associated with obesity, breast, and colon cancer. Wendy Wagner agrees to continue taking prescription Vit D @50 ,000 IU every week #4 and we will refill for 1 month. She will follow up for routine testing of vitamin D, at least 2-3 times per year. She was informed of the risk of over-replacement of vitamin D and agrees to not increase her dose unless she discusses this with [place] first. Wendy Wagner agrees to follow up with our clinic in 3 weeks.  Obesity Wendy Wagner is currently in the action stage of change. As such, her goal is to continue with  weight loss efforts She has agreed to follow the Category 3 plan Wendy Wagner has been instructed to work up to a goal of 150 minutes of combined cardio and strengthening exercise per week for weight loss and overall health benefits. We discussed the following Behavioral Modification Strategies today: increasing lean protein intake, no skipping meals, and keeping healthy foods in the home   Wendy Wagner has agreed to follow up with our clinic in 3 weeks. She was informed of the importance of frequent follow up visits to maximize her success with intensive lifestyle modifications for her multiple health conditions.   OBESITY BEHAVIORAL INTERVENTION VISIT  Today's visit was # 10   Starting weight: 340 lbs Starting date: 01/28/18 Today's weight : 314 lbs Today's date: 07/09/2018 Total lbs lost to date: 22    ASK: We discussed the diagnosis of obesity with Albin Fischer today and Nai agreed to give [place] permission to discuss obesity behavioral modification therapy today.  ASSESS: Davian has the diagnosis of obesity and her BMI today is 49.17 Bettey is in the action stage of change   ADVISE: Luther was educated on the multiple health risks of obesity as well as the benefit of weight loss to improve her health. She was advised of the need for long term treatment and the importance of lifestyle modifications.  AGREE: Multiple dietary modification options and treatment options were discussed and  Morrison agreed to the above obesity treatment plan.  Wilhemena Durie, am acting as transcriptionist for Abby Potash, PA-C I, Abby Potash, PA-C have reviewed above note and agree with its content

## 2018-07-28 ENCOUNTER — Encounter: Payer: Self-pay | Admitting: Family Medicine

## 2018-07-28 ENCOUNTER — Ambulatory Visit: Payer: BLUE CROSS/BLUE SHIELD | Admitting: Family Medicine

## 2018-07-28 ENCOUNTER — Ambulatory Visit: Payer: BLUE CROSS/BLUE SHIELD | Admitting: Internal Medicine

## 2018-07-28 VITALS — BP 130/78 | HR 70 | Temp 98.4°F | Ht 67.0 in | Wt 318.5 lb

## 2018-07-28 DIAGNOSIS — M545 Low back pain, unspecified: Secondary | ICD-10-CM

## 2018-07-28 DIAGNOSIS — R222 Localized swelling, mass and lump, trunk: Secondary | ICD-10-CM | POA: Diagnosis not present

## 2018-07-28 LAB — POC URINALSYSI DIPSTICK (AUTOMATED)
BILIRUBIN UA: NEGATIVE
Blood, UA: NEGATIVE
Glucose, UA: NEGATIVE
KETONES UA: NEGATIVE
Leukocytes, UA: NEGATIVE
Nitrite, UA: NEGATIVE
PH UA: 5.5 (ref 5.0–8.0)
PROTEIN UA: NEGATIVE
Urobilinogen, UA: 0.2 E.U./dL

## 2018-07-28 MED ORDER — METHOCARBAMOL 500 MG PO TABS: 500.0000 mg | ORAL_TABLET | Freq: Two times a day (BID) | ORAL | 0 refills | Status: DC | PRN

## 2018-07-28 MED ORDER — TRIAMCINOLONE ACETONIDE 0.1 % EX CREA: 1.0000 "application " | TOPICAL_CREAM | Freq: Two times a day (BID) | CUTANEOUS | 0 refills | Status: DC

## 2018-07-28 MED ORDER — NAPROXEN 500 MG PO TABS: 500.0000 mg | ORAL_TABLET | Freq: Two times a day (BID) | ORAL | 0 refills | Status: DC | PRN

## 2018-07-28 NOTE — Progress Notes (Addendum)
BP 130/78 (BP Location: Left Arm, Patient Position: Sitting, Cuff Size: Large)   Pulse 70   Temp 98.4 F (36.9 C) (Oral)   Ht 5\' 7"  (1.702 m)   Wt (!) 318 lb 8 oz (144.5 kg)   LMP 05/22/2016   SpO2 99%   BMI 49.88 kg/m    CC: lower back pain, urinary frequency Subjective:    Patient ID: Wendy Wagner, female    DOB: Jan 04, 1966, 52 y.o.   MRN: [id number]  HPI: Wendy Wagner is a 52 y.o. female presenting on 07/28/2018 for Back Pain (C/o bilateral low back pain. Started about 1 wk ago. ); Urinary Frequency (C/o having to "go" often. Started about 1-2 days ago. ); and Abdominal Pain (C/o lower abd cramping and bilateral groin pain. )   1 wk h/o lower back pain started left, radiated to right side over last few days. This has been associated with lower abdominal cramping and increased urinary frequency and urgency. Denies inciting trauma/injury to lower back. She has been taking naprosyn daily in the mornings. Not taking ibuprofen.  Denies fevers/chills, dysuria, hematuria, nausea/vomiting, bowel changes of diarrhea or constipation, flank pain.  No recent UTI.   Currently [facility] by [facility] for weight loss.   Relevant past medical, surgical, family and social history reviewed and updated as indicated. Interim medical history since our last visit reviewed. Allergies and medications reviewed and updated. Outpatient Medications Prior to Visit  Medication Sig Dispense Refill  . ibuprofen (ADVIL,MOTRIN) 800 MG tablet Take 1 tablet (800 mg total) by mouth every 8 (eight) hours as needed. 90 tablet 0  . metFORMIN (GLUCOPHAGE) 500 MG tablet Take 1 tablet (500 mg total) by mouth daily with breakfast. 30 tablet 0  . traMADol (ULTRAM) 50 MG tablet Take 50 mg by mouth every 6 (six) hours as needed.   0  . Vitamin D, Ergocalciferol, (DRISDOL) 50000 units CAPS capsule Take 1 capsule (50,000 Units total) by mouth every 7 (seven) days. 4 capsule 0  . naproxen  (NAPROSYN) 500 MG tablet TAKE 1 TABLET BY MOUTH TWICE A DAY WITH FOOD 30 tablet 0  . triamcinolone cream (KENALOG) 0.1 % Apply 1 application topically 2 (two) times daily. 30 g 0  . benzonatate (TESSALON) 100 MG capsule Take 1-2 capsules (100-200 mg total) by mouth 3 (three) times daily as needed. 20 capsule 0   No facility-administered medications prior to visit.      Per HPI unless specifically indicated in ROS section below Review of Systems     Objective:    BP 130/78 (BP Location: Left Arm, Patient Position: Sitting, Cuff Size: Large)   Pulse 70   Temp 98.4 F (36.9 C) (Oral)   Ht 5\' 7"  (1.702 m)   Wt (!) 318 lb 8 oz (144.5 kg)   LMP 05/22/2016   SpO2 99%   BMI 49.88 kg/m   Wt Readings from Last 3 Encounters:  07/28/18 (!) 318 lb 8 oz (144.5 kg)  07/09/18 (!) 314 lb (142.4 kg)  06/18/18 (!) 320 lb (145.2 kg)    Physical Exam  Constitutional: She appears well-developed and well-nourished. No distress.  HENT:  Mouth/Throat: Oropharynx is clear and moist. No oropharyngeal exudate.  Cardiovascular: Normal rate and regular rhythm.  Murmur (3/6 systolic) heard. Pulmonary/Chest: Effort normal and breath sounds normal. No respiratory distress. She has no wheezes. She has no rales.  Abdominal: Soft. Bowel sounds are normal. She exhibits mass (circumscribed mobile firm mass to R  of umbilicus). She exhibits no distension. There is no hepatosplenomegaly. There is no tenderness. There is no rebound, no guarding, no CVA tenderness and negative Murphy's sign. No hernia.  Musculoskeletal: She exhibits no edema.  Mild midline discomfort mid lumbar spine ++ R>L upper lumbar paraspinous mm tenderness Neg SLR bilaterally.  Neurological: She is alert.  Skin: Skin is warm and dry. No rash noted.  Psychiatric: She has a normal mood and affect.  Nursing note and vitals reviewed.  Results for orders placed or performed in visit on 07/28/18  POCT Urinalysis Dipstick (Automated)  Result  Value Ref Range   Color, UA yellow    Clarity, UA clear    Glucose, UA Negative Negative   Bilirubin, UA negative    Ketones, UA negative    Spec Grav, UA >=1.030 (A) 1.010 - 1.025   Blood, UA negative    pH, UA 5.5 5.0 - 8.0   Protein, UA Negative Negative   Urobilinogen, UA 0.2 0.2 or 1.0 E.U./dL   Nitrite, UA negative    Leukocytes, UA Negative Negative      Assessment & Plan:   Problem List Items Addressed This Visit    Acute lumbar back pain - Primary    Story/exam most consistent with lumbar strain. rec supportive care of heating pad, gentle stretching, topical rub, continue NSAID, add Rx robaxin muscle relaxant. Update if not improving with treatment.  UA without signs of infection today - but I did recommend increased water intake.       Relevant Medications   naproxen (NAPROSYN) 500 MG tablet   methocarbamol (ROBAXIN) 500 MG tablet   Other Relevant Orders   POCT Urinalysis Dipstick (Automated) (Completed)   Abdominal wall mass    Anticipate benign lipoma, r/o other abdominal mass. Will check soft tissue US.       Relevant Orders   US Abdomen Limited       Meds ordered this encounter  Medications  . naproxen (NAPROSYN) 500 MG tablet    Sig: Take 1 tablet (500 mg total) by mouth 2 (two) times daily as needed for moderate pain (take with food).    Dispense:  40 tablet    Refill:  0  . triamcinolone cream (KENALOG) 0.1 %    Sig: Apply 1 application topically 2 (two) times daily.    Dispense:  30 g    Refill:  0  . methocarbamol (ROBAXIN) 500 MG tablet    Sig: Take 1 tablet (500 mg total) by mouth 2 (two) times daily as needed for muscle spasms (sedation precautions).    Dispense:  40 tablet    Refill:  0   Orders Placed This Encounter  Procedures  . US Abdomen Limited    Standing Status:   Future    Standing Expiration Date:   09/29/2019    Order Specific Question:   Reason for Exam (SYMPTOM  OR DIAGNOSIS REQUIRED)    Answer:   R periumbilical firm  mobile mass to abdominal wall, eval for lipoma    Order Specific Question:   Preferred imaging location?    Answer:   [id number]  . POCT Urinalysis Dipstick (Automated)    Follow up plan: No follow-ups on file.  Ria Bush, MD

## 2018-07-28 NOTE — Patient Instructions (Addendum)
I will order a soft tissue ultrasound for abdomen to check on firmer area - likely lipoma of skin.  I think you have back strain - treat with heating pad, gentle stretching of lower back, continue naprosyn, may increase to twice daily or add ibuprofen at night. Add muscle relaxant sent to pharmacy (robaxin 500mg ).  Let us know if not improving with treatment.

## 2018-07-28 NOTE — Assessment & Plan Note (Addendum)
Story/exam most consistent with lumbar strain. rec supportive care of heating pad, gentle stretching, topical rub, continue NSAID, add Rx robaxin muscle relaxant. Update if not improving with treatment.  UA without signs of infection today - but I did recommend increased water intake.

## 2018-07-28 NOTE — Assessment & Plan Note (Signed)
Anticipate benign lipoma, r/o other abdominal mass. Will check soft tissue US.

## 2018-07-29 NOTE — Addendum Note (Signed)
Addended by: Ria Bush on: 07/29/2018 10:05 AM   Modules accepted: Orders

## 2018-07-30 ENCOUNTER — Ambulatory Visit (INDEPENDENT_AMBULATORY_CARE_PROVIDER_SITE_OTHER): Payer: BLUE CROSS/BLUE SHIELD | Admitting: Physician Assistant

## 2018-08-03 ENCOUNTER — Ambulatory Visit (INDEPENDENT_AMBULATORY_CARE_PROVIDER_SITE_OTHER): Payer: BLUE CROSS/BLUE SHIELD | Admitting: Physician Assistant

## 2018-08-03 ENCOUNTER — Encounter (INDEPENDENT_AMBULATORY_CARE_PROVIDER_SITE_OTHER): Payer: Self-pay | Admitting: Physician Assistant

## 2018-08-03 ENCOUNTER — Ambulatory Visit
Admission: RE | Admit: 2018-08-03 | Discharge: 2018-08-03 | Disposition: A | Discharge status: Home / Self Care | Payer: BLUE CROSS/BLUE SHIELD | Source: Ambulatory Visit | Attending: Family Medicine | Admitting: Family Medicine

## 2018-08-03 VITALS — BP 112/70 | HR 56 | Temp 98.6°F | Ht 67.0 in | Wt 316.0 lb

## 2018-08-03 DIAGNOSIS — R7303 Prediabetes: Secondary | ICD-10-CM | POA: Diagnosis not present

## 2018-08-03 DIAGNOSIS — Z9189 Other specified personal risk factors, not elsewhere classified: Secondary | ICD-10-CM | POA: Diagnosis not present

## 2018-08-03 DIAGNOSIS — E559 Vitamin D deficiency, unspecified: Secondary | ICD-10-CM

## 2018-08-03 DIAGNOSIS — Z6841 Body Mass Index (BMI) 40.0 and over, adult: Secondary | ICD-10-CM

## 2018-08-03 DIAGNOSIS — R222 Localized swelling, mass and lump, trunk: Secondary | ICD-10-CM

## 2018-08-03 MED ORDER — METFORMIN HCL 500 MG PO TABS: 500.0000 mg | ORAL_TABLET | Freq: Every day | ORAL | 0 refills | Status: DC

## 2018-08-04 NOTE — Progress Notes (Signed)
Office: [phone]  /  Fax: [phone]   HPI:   Chief Complaint: OBESITY Wendy Wagner is here to discuss her progress with her obesity treatment plan. She is on the Category 3 plan and is following her eating plan approximately 80 % of the time. She states she is exercising 0 minutes 0 times per week. Wendy Wagner continues to struggle with getting all of her protein in, especially at dinner. She is at times skipping dinner after eating a large lunch with chicken and salad. She is ready to get back on track.  Her weight is (!) 316 lb (143.3 kg) today and has gained 2 pounds since her last visit. She has lost 24 lbs since starting treatment with [place].  Pre-Diabetes Wendy Wagner has a diagnosis of pre-diabetes based on her elevated Hgb A1c and was informed this puts her at greater risk of developing diabetes. Wendy Wagner nausea, vomiting, or diarrhea on metformin, and continues to work on diet and exercise to decrease risk of diabetes. She denies polyphagia or hypoglycemia.  Vitamin D Deficiency Wendy Wagner has a diagnosis of vitamin D deficiency. She is currently taking prescription Vit D and denies nausea, vomiting or muscle weakness.  At risk for osteopenia and osteoporosis Wendy Wagner is at higher risk of osteopenia and osteoporosis due to vitamin D deficiency.   ALLERGIES: Allergies  Allergen Reactions  . No Known Drug Allergy     MEDICATIONS: Current Outpatient Medications on File Prior to Visit  Medication Sig Dispense Refill  . ibuprofen (ADVIL,MOTRIN) 800 MG tablet Take 1 tablet (800 mg total) by mouth every 8 (eight) hours as needed. 90 tablet 0  . methocarbamol (ROBAXIN) 500 MG tablet Take 1 tablet (500 mg total) by mouth 2 (two) times daily as needed for muscle spasms (sedation precautions). 40 tablet 0  . naproxen (NAPROSYN) 500 MG tablet Take 1 tablet (500 mg total) by mouth 2 (two) times daily as needed for moderate pain (take with food). 40 tablet 0  . traMADol (ULTRAM) 50 MG tablet Take 50  mg by mouth every 6 (six) hours as needed.   0  . triamcinolone cream (KENALOG) 0.1 % Apply 1 application topically 2 (two) times daily. 30 g 0  . Vitamin D, Ergocalciferol, (DRISDOL) 50000 units CAPS capsule Take 1 capsule (50,000 Units total) by mouth every 7 (seven) days. 4 capsule 0   No current facility-administered medications on file prior to visit.     PAST MEDICAL HISTORY: Past Medical History:  Diagnosis Date  . Anemia   . Back pain   . Childhood asthma   . GERD (gastroesophageal reflux disease)   . Heart murmur   . Leg edema     PAST SURGICAL HISTORY: Past Surgical History:  Procedure Laterality Date  . CESAREAN SECTION    . WISDOM TOOTH EXTRACTION      SOCIAL HISTORY: Social History   Tobacco Use  . Smoking status: Former Smoker    Packs/day: 1.00    Years: 8.00    Pack years: 8.00    Types: Cigarettes  . Smokeless tobacco: Never Used  Substance Use Topics  . Alcohol use: No    Alcohol/week: 0.0 standard drinks  . Drug use: No    FAMILY HISTORY: Family History  Problem Relation Age of Onset  . Cancer Mother        brain  . Cancer Father        brain, lung  . Alcoholism Father   . Diabetes Neg Hx   . Heart disease  Neg Hx   . Stroke Neg Hx     ROS: Review of Systems  Constitutional: Negative for weight loss.  Gastrointestinal: Negative for diarrhea, nausea and vomiting.  Musculoskeletal:       Negative muscle weakness  Endo/Heme/Allergies:       Negative polyphagia Negative hypoglycemia    PHYSICAL EXAM: Blood pressure 112/70, pulse (!) 56, temperature 98.6 F (37 C), temperature source Oral, height 5\' 7"  (1.702 m), weight (!) 316 lb (143.3 kg), last menstrual period 05/22/2016, SpO2 99 %. Body mass index is 49.49 kg/m. Physical Exam  Constitutional: She is oriented to person, place, and time. She appears well-developed and well-nourished.  Cardiovascular: Normal rate.  Pulmonary/Chest: Effort normal.  Musculoskeletal: Normal range  of motion.  Neurological: She is oriented to person, place, and time.  Skin: Skin is warm and dry.  Psychiatric: She has a normal mood and affect. Her behavior is normal.  Vitals reviewed.   RECENT LABS AND TESTS: BMET    Component Value Date/Time   NA 143 06/04/2018 0938   K 4.5 06/04/2018 0938   CL 104 06/04/2018 0938   CO2 22 06/04/2018 0938   GLUCOSE 95 06/04/2018 0938   GLUCOSE 90 05/30/2017 1510   BUN 11 06/04/2018 0938   CREATININE 0.89 06/04/2018 0938   CALCIUM 9.1 06/04/2018 0938   GFRNONAA 74 06/04/2018 0938   GFRAA 85 06/04/2018 0938   Lab Results  Component Value Date   HGBA1C 5.8 (H) 06/04/2018   HGBA1C 5.9 (H) 01/28/2018   HGBA1C 6.2 05/30/2017   HGBA1C 6.2 05/29/2016   HGBA1C 5.8 02/23/2015   Lab Results  Component Value Date   INSULIN 21.9 06/04/2018   INSULIN 20.4 01/28/2018   CBC    Component Value Date/Time   WBC 4.3 01/28/2018 1147   WBC 4.8 05/30/2017 1510   RBC 4.57 01/28/2018 1147   RBC 4.40 05/30/2017 1510   HGB 12.2 01/28/2018 1147   HCT 39.4 01/28/2018 1147   PLT 310.0 05/30/2017 1510   MCV 86 01/28/2018 1147   MCH 26.7 01/28/2018 1147   MCHC 31.0 (L) 01/28/2018 1147   MCHC 31.8 05/30/2017 1510   RDW 14.9 01/28/2018 1147   LYMPHSABS 2.0 01/28/2018 1147   EOSABS 0.2 01/28/2018 1147   BASOSABS 0.1 01/28/2018 1147   Iron/TIBC/Ferritin/ %Sat No results found for: IRON, TIBC, FERRITIN, IRONPCTSAT Lipid Panel     Component Value Date/Time   CHOL 174 06/04/2018 0938   TRIG 91 06/04/2018 0938   HDL 67 06/04/2018 0938   CHOLHDL 3 05/30/2017 1510   VLDL 24.8 05/30/2017 1510   LDLCALC 89 06/04/2018 0938   Hepatic Function Panel     Component Value Date/Time   PROT 6.8 06/04/2018 0938   ALBUMIN 4.0 06/04/2018 0938   AST 11 06/04/2018 0938   ALT 12 06/04/2018 0938   ALKPHOS 77 06/04/2018 0938   BILITOT 0.3 06/04/2018 0938      Component Value Date/Time   TSH 0.888 01/28/2018 1147   TSH 0.79 02/23/2015 1512  Results for  Wendy, Wagner (MRN [id number]) as of 08/04/2018 17:37  Ref. Range 06/04/2018 09:38  Vitamin D, 25-Hydroxy Latest Ref Range: 30.0 - 100.0 ng/mL 26.3 (L)    ASSESSMENT AND PLAN: Prediabetes - Plan: metFORMIN (GLUCOPHAGE) 500 MG tablet  Vitamin D deficiency  At risk for osteoporosis  Class 3 severe obesity with serious comorbidity and body mass index (BMI) of 45.0 to 49.9 in adult, unspecified obesity type ([place])  PLAN:  Pre-Diabetes Wendy Wagner will  continue to work on weight loss, exercise, and decreasing simple carbohydrates in her diet to help decrease the risk of diabetes. We dicussed metformin including benefits and risks. She was informed that eating too many simple carbohydrates or too many calories at one sitting increases the likelihood of GI side effects. Amberlynn agrees continue taking metformin 500 mg q AM #30 and we will refill for 1 month. Wendy Wagner agrees to follow up with our clinic in 3 weeks as directed to monitor her progress.  Vitamin D Deficiency Wendy Wagner was informed that low vitamin D levels contributes to fatigue and are associated with obesity, breast, and colon cancer. Wendy Wagner agrees to continue taking prescription Vit D @50 ,000 IU every week and will follow up for routine testing of vitamin D, at least 2-3 times per year. She was informed of the risk of over-replacement of vitamin D and agrees to not increase her dose unless she discusses this with [place] first. Wendy Wagner agrees to follow up with our clinic in 3 weeks.  At risk for osteopenia and osteoporosis Wendy Wagner was given extended (15 minutes) osteoporosis prevention counseling today. Wendy Wagner is at risk for osteopenia and osteoporsis due to her vitamin D deficiency. She was encouraged to take her vitamin D and follow her higher calcium diet and increase strengthening exercise to help strengthen her bones and decrease her risk of osteopenia and osteoporosis.  Obesity Wendy Wagner is currently in the action stage of change. As  such, her goal is to continue with weight loss efforts She has agreed to follow the Category 3 plan Wendy Wagner has been instructed to work up to a goal of 150 minutes of combined cardio and strengthening exercise per week for weight loss and overall health benefits. We discussed the following Behavioral Modification Strategies today: increasing lean protein intake, work on meal planning and easy cooking plans, and no skipping meals   Wendy Wagner has agreed to follow up with our clinic in 3 weeks. She was informed of the importance of frequent follow up visits to maximize her success with intensive lifestyle modifications for her multiple health conditions.   OBESITY BEHAVIORAL INTERVENTION VISIT  Today's visit was # 11   Starting weight: 340 lbs Starting date: 01/28/18 Today's weight : 316 lbs Today's date: 08/03/2018 Total lbs lost to date: 24    ASK: We discussed the diagnosis of obesity with Wendy Wagner today and Wendy Wagner agreed to give [place] permission to discuss obesity behavioral modification therapy today.  ASSESS: Wendy Wagner has the diagnosis of obesity and her BMI today is 49.48 Wendy Wagner is in the action stage of change   ADVISE: Arliss was educated on the multiple health risks of obesity as well as the benefit of weight loss to improve her health. She was advised of the need for long term treatment and the importance of lifestyle modifications.  AGREE: Multiple dietary modification options and treatment options were discussed and  Wendy Wagner agreed to the above obesity treatment plan.  Wendy Wagner, am acting as transcriptionist for Abby Potash, PA-C I, Abby Potash, PA-C have reviewed above note and agree with its content

## 2018-08-15 ENCOUNTER — Other Ambulatory Visit: Payer: Self-pay | Admitting: Family Medicine

## 2018-08-15 DIAGNOSIS — R1900 Intra-abdominal and pelvic swelling, mass and lump, unspecified site: Secondary | ICD-10-CM

## 2018-08-15 DIAGNOSIS — R222 Localized swelling, mass and lump, trunk: Secondary | ICD-10-CM

## 2018-08-18 NOTE — Addendum Note (Signed)
Addended by: Ria Bush on: 08/18/2018 11:14 AM   Modules accepted: Orders

## 2018-08-20 ENCOUNTER — Ambulatory Visit (INDEPENDENT_AMBULATORY_CARE_PROVIDER_SITE_OTHER): Payer: BLUE CROSS/BLUE SHIELD | Admitting: Physician Assistant

## 2018-08-20 VITALS — BP 118/76 | HR 62 | Temp 98.2°F | Ht 67.0 in | Wt 316.0 lb

## 2018-08-20 DIAGNOSIS — Z9189 Other specified personal risk factors, not elsewhere classified: Secondary | ICD-10-CM

## 2018-08-20 DIAGNOSIS — R7303 Prediabetes: Secondary | ICD-10-CM

## 2018-08-20 DIAGNOSIS — E559 Vitamin D deficiency, unspecified: Secondary | ICD-10-CM | POA: Diagnosis not present

## 2018-08-20 DIAGNOSIS — Z6841 Body Mass Index (BMI) 40.0 and over, adult: Secondary | ICD-10-CM

## 2018-08-20 MED ORDER — METFORMIN HCL 500 MG PO TABS: 500.0000 mg | ORAL_TABLET | Freq: Every day | ORAL | 0 refills | Status: DC

## 2018-08-20 MED ORDER — VITAMIN D (ERGOCALCIFEROL) 1.25 MG (50000 UNIT) PO CAPS: 50000.0000 [IU] | ORAL_CAPSULE | ORAL | 0 refills | Status: DC

## 2018-08-24 NOTE — Progress Notes (Signed)
Office: [phone]  /  Fax: [phone]   HPI:   Chief Complaint: OBESITY Wendy Wagner is here to discuss her progress with her obesity treatment plan. She is on the Category 3 plan and is following her eating plan approximately 85 % of the time. She states she is exercising 0 minutes 0 times per week. Wendy Wagner did well with weight maintenance. She reports getting all of the food in on her plan. She wants to start exercising.  Her weight is (!) 316 lb (143.3 kg) today and has not lost weight since her last visit. She has lost 24 lbs since starting treatment with [place].  Pre-Diabetes Wendy Wagner has a diagnosis of pre-diabetes based on her elevated Hgb A1c and was informed this puts her at greater risk of developing diabetes. She denies nausea, vomiting, or diarrhea on metformin and continues to work on diet and exercise to decrease risk of diabetes. She denies polyphagia or hypoglycemia.  At risk for diabetes Wendy Wagner is at higher than average risk for developing diabetes due to her obesity and pre-diabetes. She currently denies polyuria or polydipsia.  Vitamin D Deficiency Wendy Wagner has a diagnosis of vitamin D deficiency. She is currently taking prescription Vit D and denies nausea, vomiting or muscle weakness.  ALLERGIES: Allergies  Allergen Reactions  . No Known Drug Allergy     MEDICATIONS: Current Outpatient Medications on File Prior to Visit  Medication Sig Dispense Refill  . ibuprofen (ADVIL,MOTRIN) 800 MG tablet Take 1 tablet (800 mg total) by mouth every 8 (eight) hours as needed. 90 tablet 0  . methocarbamol (ROBAXIN) 500 MG tablet Take 1 tablet (500 mg total) by mouth 2 (two) times daily as needed for muscle spasms (sedation precautions). 40 tablet 0  . naproxen (NAPROSYN) 500 MG tablet Take 1 tablet (500 mg total) by mouth 2 (two) times daily as needed for moderate pain (take with food). 40 tablet 0  . traMADol (ULTRAM) 50 MG tablet Take 50 mg by mouth every 6 (six) hours as  needed.   0  . triamcinolone cream (KENALOG) 0.1 % Apply 1 application topically 2 (two) times daily. 30 g 0   No current facility-administered medications on file prior to visit.     PAST MEDICAL HISTORY: Past Medical History:  Diagnosis Date  . Anemia   . Back pain   . Childhood asthma   . GERD (gastroesophageal reflux disease)   . Heart murmur   . Leg edema     PAST SURGICAL HISTORY: Past Surgical History:  Procedure Laterality Date  . CESAREAN SECTION    . WISDOM TOOTH EXTRACTION      SOCIAL HISTORY: Social History   Tobacco Use  . Smoking status: Former Smoker    Packs/day: 1.00    Years: 8.00    Pack years: 8.00    Types: Cigarettes  . Smokeless tobacco: Never Used  Substance Use Topics  . Alcohol use: No    Alcohol/week: 0.0 standard drinks  . Drug use: No    FAMILY HISTORY: Family History  Problem Relation Age of Onset  . Cancer Mother        brain  . Cancer Father        brain, lung  . Alcoholism Father   . Diabetes Neg Hx   . Heart disease Neg Hx   . Stroke Neg Hx     ROS: Review of Systems  Constitutional: Negative for weight loss.  Gastrointestinal: Negative for diarrhea, nausea and vomiting.  Genitourinary: Negative for  frequency.  Musculoskeletal:       Negative muscle weakness  Endo/Heme/Allergies: Negative for polydipsia.       Negative polyphagia Negative hypoglycemia    PHYSICAL EXAM: Blood pressure 118/76, pulse 62, temperature 98.2 F (36.8 C), temperature source Oral, height 5\' 7"  (1.702 m), weight (!) 316 lb (143.3 kg), last menstrual period 05/22/2016, SpO2 99 %. Body mass index is 49.49 kg/m. Physical Exam  Constitutional: She is oriented to person, place, and time. She appears well-developed and well-nourished.  Cardiovascular: Normal rate.  Pulmonary/Chest: Effort normal.  Musculoskeletal: Normal range of motion.  Neurological: She is oriented to person, place, and time.  Skin: Skin is warm and dry.  Psychiatric:  She has a normal mood and affect. Her behavior is normal.  Vitals reviewed.   RECENT LABS AND TESTS: BMET    Component Value Date/Time   NA 143 06/04/2018 0938   K 4.5 06/04/2018 0938   CL 104 06/04/2018 0938   CO2 22 06/04/2018 0938   GLUCOSE 95 06/04/2018 0938   GLUCOSE 90 05/30/2017 1510   BUN 11 06/04/2018 0938   CREATININE 0.89 06/04/2018 0938   CALCIUM 9.1 06/04/2018 0938   GFRNONAA 74 06/04/2018 0938   GFRAA 85 06/04/2018 0938   Lab Results  Component Value Date   HGBA1C 5.8 (H) 06/04/2018   HGBA1C 5.9 (H) 01/28/2018   HGBA1C 6.2 05/30/2017   HGBA1C 6.2 05/29/2016   HGBA1C 5.8 02/23/2015   Lab Results  Component Value Date   INSULIN 21.9 06/04/2018   INSULIN 20.4 01/28/2018   CBC    Component Value Date/Time   WBC 4.3 01/28/2018 1147   WBC 4.8 05/30/2017 1510   RBC 4.57 01/28/2018 1147   RBC 4.40 05/30/2017 1510   HGB 12.2 01/28/2018 1147   HCT 39.4 01/28/2018 1147   PLT 310.0 05/30/2017 1510   MCV 86 01/28/2018 1147   MCH 26.7 01/28/2018 1147   MCHC 31.0 (L) 01/28/2018 1147   MCHC 31.8 05/30/2017 1510   RDW 14.9 01/28/2018 1147   LYMPHSABS 2.0 01/28/2018 1147   EOSABS 0.2 01/28/2018 1147   BASOSABS 0.1 01/28/2018 1147   Iron/TIBC/Ferritin/ %Sat No results found for: IRON, TIBC, FERRITIN, IRONPCTSAT Lipid Panel     Component Value Date/Time   CHOL 174 06/04/2018 0938   TRIG 91 06/04/2018 0938   HDL 67 06/04/2018 0938   CHOLHDL 3 05/30/2017 1510   VLDL 24.8 05/30/2017 1510   LDLCALC 89 06/04/2018 0938   Hepatic Function Panel     Component Value Date/Time   PROT 6.8 06/04/2018 0938   ALBUMIN 4.0 06/04/2018 0938   AST 11 06/04/2018 0938   ALT 12 06/04/2018 0938   ALKPHOS 77 06/04/2018 0938   BILITOT 0.3 06/04/2018 0938      Component Value Date/Time   TSH 0.888 01/28/2018 1147   TSH 0.79 02/23/2015 1512  Results for Wendy, Wagner (MRN [id number]) as of 08/24/2018 11:24  Ref. Range 06/04/2018 09:38  Vitamin D, 25-Hydroxy Latest Ref  Range: 30.0 - 100.0 ng/mL 26.3 (L)    ASSESSMENT AND PLAN: Prediabetes - Plan: metFORMIN (GLUCOPHAGE) 500 MG tablet  Vitamin D deficiency - Plan: Vitamin D, Ergocalciferol, (DRISDOL) 1.25 MG (50000 UT) CAPS capsule  At risk for diabetes mellitus  Class 3 severe obesity with serious comorbidity and body mass index (BMI) of 45.0 to 49.9 in adult, unspecified obesity type [facility])  PLAN:  Pre-Diabetes Ajanee will continue to work on weight loss, exercise, and decreasing simple carbohydrates in her diet  to help decrease the risk of diabetes. We dicussed metformin including benefits and risks. She was informed that eating too many simple carbohydrates or too many calories at one sitting increases the likelihood of GI side effects. Wendy Wagner agrees to continue taking metformin 500 mg q AM #30 and we will refill for 1 month. Wendy Wagner agrees to follow up with our clinic in 3 weeks as directed to monitor her progress.  Diabetes risk counselling Wendy Wagner was given extended (15 minutes) diabetes prevention counseling today. She is 52 y.o. female and has risk factors for diabetes including obesity and pre-diabetes. We discussed intensive lifestyle modifications today with an emphasis on weight loss as well as increasing exercise and decreasing simple carbohydrates in her diet.  Vitamin D Deficiency Wendy Wagner was informed that low vitamin D levels contributes to fatigue and are associated with obesity, breast, and colon cancer. Wendy Wagner agrees to continue taking prescription Vit D @50 ,000 IU every week #4 and we will refill for 1 month. She will follow up for routine testing of vitamin D, at least 2-3 times per year. She was informed of the risk of over-replacement of vitamin D and agrees to not increase her dose unless she discusses this with [place] first. Wendy Wagner agrees to follow up with our clinic in 3 weeks.  Obesity Wendy Wagner is currently in the action stage of change. As such, her goal is to continue with weight  loss efforts She has agreed to follow the Category 3 plan Wendy Wagner has been instructed to work up to a goal of 150 minutes of combined cardio and strengthening exercise per week for weight loss and overall health benefits. Resistance bands were given. We discussed the following Behavioral Modification Strategies today: increasing lean protein intake and work on meal planning and easy cooking plans   Wendy Wagner has agreed to follow up with our clinic in 3 weeks. She was informed of the importance of frequent follow up visits to maximize her success with intensive lifestyle modifications for her multiple health conditions.   OBESITY BEHAVIORAL INTERVENTION VISIT  Today's visit was # 12   Starting weight: 340 lbs Starting date: 01/28/18 Today's weight : 316 lbs  Today's date: 08/20/2018 Total lbs lost to date: 24    ASK: We discussed the diagnosis of obesity with Wendy Wagner today and Wendy Wagner agreed to give [place] permission to discuss obesity behavioral modification therapy today.  ASSESS: Wendy Wagner has the diagnosis of obesity and her BMI today is 49.48 Wendy Wagner is in the action stage of change   ADVISE: Wendy Wagner was educated on the multiple health risks of obesity as well as the benefit of weight loss to improve her health. She was advised of the need for long term treatment and the importance of lifestyle modifications.  AGREE: Multiple dietary modification options and treatment options were discussed and  Wendy Wagner agreed to the above obesity treatment plan.  Wendy Wagner, am acting as transcriptionist for Abby Potash, PA-C I, Abby Potash, PA-C have reviewed above note and agree with its content

## 2018-08-29 ENCOUNTER — Other Ambulatory Visit: Payer: BLUE CROSS/BLUE SHIELD

## 2018-09-10 ENCOUNTER — Ambulatory Visit (INDEPENDENT_AMBULATORY_CARE_PROVIDER_SITE_OTHER): Payer: BLUE CROSS/BLUE SHIELD | Admitting: Physician Assistant

## 2018-09-15 ENCOUNTER — Other Ambulatory Visit: Payer: Self-pay | Admitting: Family Medicine

## 2018-09-15 ENCOUNTER — Ambulatory Visit
Admission: RE | Admit: 2018-09-15 | Discharge: 2018-09-15 | Disposition: A | Discharge status: Home / Self Care | Payer: BLUE CROSS/BLUE SHIELD | Source: Ambulatory Visit | Attending: Family Medicine | Admitting: Family Medicine

## 2018-09-15 DIAGNOSIS — R1900 Intra-abdominal and pelvic swelling, mass and lump, unspecified site: Secondary | ICD-10-CM

## 2018-09-15 DIAGNOSIS — R222 Localized swelling, mass and lump, trunk: Secondary | ICD-10-CM

## 2018-09-22 ENCOUNTER — Encounter (INDEPENDENT_AMBULATORY_CARE_PROVIDER_SITE_OTHER): Payer: Self-pay | Admitting: Physician Assistant

## 2018-09-22 ENCOUNTER — Ambulatory Visit (INDEPENDENT_AMBULATORY_CARE_PROVIDER_SITE_OTHER): Payer: BLUE CROSS/BLUE SHIELD | Admitting: Physician Assistant

## 2018-09-22 VITALS — BP 108/72 | HR 71 | Temp 97.8°F | Ht 67.0 in | Wt 316.0 lb

## 2018-09-22 DIAGNOSIS — E66813 Obesity, class 3: Secondary | ICD-10-CM

## 2018-09-22 DIAGNOSIS — E559 Vitamin D deficiency, unspecified: Secondary | ICD-10-CM | POA: Diagnosis not present

## 2018-09-22 DIAGNOSIS — Z9189 Other specified personal risk factors, not elsewhere classified: Secondary | ICD-10-CM

## 2018-09-22 DIAGNOSIS — Z6841 Body Mass Index (BMI) 40.0 and over, adult: Secondary | ICD-10-CM

## 2018-09-22 DIAGNOSIS — R7303 Prediabetes: Secondary | ICD-10-CM

## 2018-09-22 MED ORDER — METFORMIN HCL 500 MG PO TABS: 500.0000 mg | ORAL_TABLET | Freq: Every day | ORAL | 0 refills | Status: DC

## 2018-09-22 MED ORDER — VITAMIN D (ERGOCALCIFEROL) 1.25 MG (50000 UNIT) PO CAPS: 50000.0000 [IU] | ORAL_CAPSULE | ORAL | 0 refills | Status: DC

## 2018-09-23 NOTE — Progress Notes (Signed)
Office: [phone]  /  Fax: [phone]   HPI:   Chief Complaint: OBESITY Wendy Wagner is here to discuss her progress with her obesity treatment plan. She is on the  follow the Category 3 plan and is following her eating plan approximately 60 to 65% of the time. She states she is walking 1 mile 3 times per week. Wendy Wagner reports that she continues to struggle getting all of her protein in throughout the day. She is ready to get back on track. Her weight is (!) 316 lb (143.3 kg) today and has not lost weight since her last visit. She has lost 24 lbs since starting treatment with [place].  Pre-Diabetes Wendy Wagner has a diagnosis of pre-diabetes based on her elevated Hgb A1c and was informed this puts her at greater risk of developing diabetes. She denies nausea, vomiting, or diarrhea on metformin. She continues to work on diet and exercise to decrease risk of diabetes. She denies polyphagia or hypoglycemia.  At risk for diabetes Wendy Wagner is at higher than average risk for developing diabetes due to her obesity. She currently denies polyuria or polydipsia.  Vitamin D Deficiency Wendy Wagner has a diagnosis of vitamin D deficiency. She is currently taking prescription Vit D and denies nausea, vomiting or muscle weakness.  ASSESSMENT AND PLAN:  Prediabetes - Plan: metFORMIN (GLUCOPHAGE) 500 MG tablet  Vitamin D deficiency - Plan: Vitamin D, Ergocalciferol, (DRISDOL) 1.25 MG (50000 UT) CAPS capsule  At risk for diabetes mellitus  Class 3 severe obesity with serious comorbidity and body mass index (BMI) of 45.0 to 49.9 in adult, unspecified obesity type [facility])  PLAN:  Pre-Diabetes Wendy Wagner will continue to work on weight loss, exercise, and decreasing simple carbohydrates in her diet to help decrease the risk of diabetes. We dicussed metformin including benefits and risks. She was informed that eating too many simple carbohydrates or too many calories at one sitting increases the likelihood of GI side  effects. Wendy Wagner agrees to continue taking metformin 500 mg q AM # 30 and we will refill for 1 month. Wendy Wagner agrees to follow up with our clinic in 4 to 5 weeks.  Vitamin D Deficiency Wendy Wagner was informed that low vitamin D levels contributes to fatigue and are associated with obesity, breast, and colon cancer. Wendy Wagner agrees to continue taking prescription Vit D @50 ,000 IU every week #4 and we will refill for 1 month. She will follow up for routine testing of vitamin D, at least 2-3 times per year. She was informed of the risk of over-replacement of vitamin D and agrees to not increase her dose unless she discusses this with [place] first. Wendy Wagner agrees to follow up with our clinic in 4 to 5 weeks.  Diabetes risk counseling Wendy Wagner was given extended (15 minutes) diabetes prevention counseling today. She is 52 y.o. female and has risk factors for diabetes including obesity. We discussed intensive lifestyle modifications today with an emphasis on weight loss as well as increasing exercise and decreasing simple carbohydrates in her diet.  Obesity Wendy Wagner is currently in the action stage of change. As such, her goal is to continue with weight loss efforts She has agreed to follow the Category 3 plan Wendy Wagner has been instructed to work up to a goal of 150 minutes of combined cardio and strengthening exercise per week for weight loss and overall health benefits. We discussed the following Behavioral Modification Strategies today: work on meal planning and easy cooking plans and holiday eating strategies  Wendy Wagner has agreed to follow up  with our clinic in 3 to 4 weeks. She was informed of the importance of frequent follow up visits to maximize her success with intensive lifestyle modifications for her multiple health conditions.  ALLERGIES: Allergies  Allergen Reactions  . No Known Drug Allergy     MEDICATIONS: Current Outpatient Medications on File Prior to Visit  Medication Sig Dispense Refill  .  ibuprofen (ADVIL,MOTRIN) 800 MG tablet Take 1 tablet (800 mg total) by mouth every 8 (eight) hours as needed. 90 tablet 0  . methocarbamol (ROBAXIN) 500 MG tablet Take 1 tablet (500 mg total) by mouth 2 (two) times daily as needed for muscle spasms (sedation precautions). 40 tablet 0  . naproxen (NAPROSYN) 500 MG tablet Take 1 tablet (500 mg total) by mouth 2 (two) times daily as needed for moderate pain (take with food). 40 tablet 0  . traMADol (ULTRAM) 50 MG tablet Take 50 mg by mouth every 6 (six) hours as needed.   0  . triamcinolone cream (KENALOG) 0.1 % Apply 1 application topically 2 (two) times daily. 30 g 0   No current facility-administered medications on file prior to visit.     PAST MEDICAL HISTORY: Past Medical History:  Diagnosis Date  . Anemia   . Back pain   . Childhood asthma   . GERD (gastroesophageal reflux disease)   . Heart murmur   . Leg edema     PAST SURGICAL HISTORY: Past Surgical History:  Procedure Laterality Date  . CESAREAN SECTION    . WISDOM TOOTH EXTRACTION      SOCIAL HISTORY: Social History   Tobacco Use  . Smoking status: Former Smoker    Packs/day: 1.00    Years: 8.00    Pack years: 8.00    Types: Cigarettes  . Smokeless tobacco: Never Used  Substance Use Topics  . Alcohol use: No    Alcohol/week: 0.0 standard drinks  . Drug use: No    FAMILY HISTORY: Family History  Problem Relation Age of Onset  . Cancer Mother        brain  . Cancer Father        brain, lung  . Alcoholism Father   . Diabetes Neg Hx   . Heart disease Neg Hx   . Stroke Neg Hx     ROS: Review of Systems  Constitutional: Negative for weight loss.  Gastrointestinal: Negative for diarrhea, nausea and vomiting.  Genitourinary: Negative for frequency.  Musculoskeletal:       Negative for muscle weakness  Endo/Heme/Allergies: Negative for polydipsia.       Denies polyphagia Denies hypoglycemia     PHYSICAL EXAM: Blood pressure 108/72, pulse 71,  temperature 97.8 F (36.6 C), temperature source Oral, height 5\' 7"  (1.702 m), weight (!) 316 lb (143.3 kg), last menstrual period 05/22/2016, SpO2 100 %. Body mass index is 49.49 kg/m. Physical Exam Vitals signs reviewed.  Constitutional:      Appearance: Normal appearance. She is obese.  Cardiovascular:     Rate and Rhythm: Normal rate.     Pulses: Normal pulses.  Pulmonary:     Effort: Pulmonary effort is normal.  Musculoskeletal: Normal range of motion.  Skin:    General: Skin is warm and dry.  Neurological:     Mental Status: She is alert and oriented to person, place, and time.  Psychiatric:        Mood and Affect: Mood normal.        Behavior: Behavior normal.  RECENT LABS AND TESTS: BMET    Component Value Date/Time   NA 143 06/04/2018 0938   K 4.5 06/04/2018 0938   CL 104 06/04/2018 0938   CO2 22 06/04/2018 0938   GLUCOSE 95 06/04/2018 0938   GLUCOSE 90 05/30/2017 1510   BUN 11 06/04/2018 0938   CREATININE 0.89 06/04/2018 0938   CALCIUM 9.1 06/04/2018 0938   GFRNONAA 74 06/04/2018 0938   GFRAA 85 06/04/2018 0938   Lab Results  Component Value Date   HGBA1C 5.8 (H) 06/04/2018   HGBA1C 5.9 (H) 01/28/2018   HGBA1C 6.2 05/30/2017   HGBA1C 6.2 05/29/2016   HGBA1C 5.8 02/23/2015   Lab Results  Component Value Date   INSULIN 21.9 06/04/2018   INSULIN 20.4 01/28/2018   CBC    Component Value Date/Time   WBC 4.3 01/28/2018 1147   WBC 4.8 05/30/2017 1510   RBC 4.57 01/28/2018 1147   RBC 4.40 05/30/2017 1510   HGB 12.2 01/28/2018 1147   HCT 39.4 01/28/2018 1147   PLT 310.0 05/30/2017 1510   MCV 86 01/28/2018 1147   MCH 26.7 01/28/2018 1147   MCHC 31.0 (L) 01/28/2018 1147   MCHC 31.8 05/30/2017 1510   RDW 14.9 01/28/2018 1147   LYMPHSABS 2.0 01/28/2018 1147   EOSABS 0.2 01/28/2018 1147   BASOSABS 0.1 01/28/2018 1147   Iron/TIBC/Ferritin/ %Sat No results found for: IRON, TIBC, FERRITIN, IRONPCTSAT Lipid Panel     Component Value Date/Time    CHOL 174 06/04/2018 0938   TRIG 91 06/04/2018 0938   HDL 67 06/04/2018 0938   CHOLHDL 3 05/30/2017 1510   VLDL 24.8 05/30/2017 1510   LDLCALC 89 06/04/2018 0938   Hepatic Function Panel     Component Value Date/Time   PROT 6.8 06/04/2018 0938   ALBUMIN 4.0 06/04/2018 0938   AST 11 06/04/2018 0938   ALT 12 06/04/2018 0938   ALKPHOS 77 06/04/2018 0938   BILITOT 0.3 06/04/2018 0938      Component Value Date/Time   TSH 0.888 01/28/2018 1147   TSH 0.79 02/23/2015 1512    Ref. Range 06/04/2018 09:38  Vitamin D, 25-Hydroxy Latest Ref Range: 30.0 - 100.0 ng/mL 26.3 (L)     OBESITY BEHAVIORAL INTERVENTION VISIT  Today's visit was # 13   Starting weight: 340 lbs Starting date: 01/28/18 Today's weight : 316 lbs Today's date: 09/22/2018 Total lbs lost to date: 24  ASK: We discussed the diagnosis of obesity with Albin Fischer today and Shareta agreed to give [place] permission to discuss obesity behavioral modification therapy today.  ASSESS: Asti has the diagnosis of obesity and her BMI today is 49.8 Evian is in the action stage of change   ADVISE: Lorra was educated on the multiple health risks of obesity as well as the benefit of weight loss to improve her health. She was advised of the need for long term treatment and the importance of lifestyle modifications to improve her current health and to decrease her risk of future health problems.  AGREE: Multiple dietary modification options and treatment options were discussed and  Galia agreed to follow the recommendations documented in the above note.  ARRANGE: Aftan was educated on the importance of frequent visits to treat obesity as outlined per CMS and USPSTF guidelines and agreed to schedule her next follow up appointment today.  Wendy Wagner, Wendy Wagner, am acting as Location manager for [employer], PA-C Wendy Wagner, Wendy Potash, PA-C have reviewed above note and agree with its content

## 2018-10-22 ENCOUNTER — Ambulatory Visit (HOSPITAL_COMMUNITY)
Admission: EM | Admit: 2018-10-22 | Discharge: 2018-10-22 | Disposition: A | Discharge status: Home / Self Care | Payer: BLUE CROSS/BLUE SHIELD | Attending: Family Medicine | Admitting: Family Medicine

## 2018-10-22 ENCOUNTER — Ambulatory Visit: Payer: Self-pay | Admitting: *Deleted

## 2018-10-22 ENCOUNTER — Encounter (HOSPITAL_COMMUNITY): Payer: Self-pay | Admitting: Emergency Medicine

## 2018-10-22 DIAGNOSIS — M7551 Bursitis of right shoulder: Secondary | ICD-10-CM | POA: Insufficient documentation

## 2018-10-22 DIAGNOSIS — R110 Nausea: Secondary | ICD-10-CM | POA: Diagnosis not present

## 2018-10-22 DIAGNOSIS — M25511 Pain in right shoulder: Secondary | ICD-10-CM | POA: Insufficient documentation

## 2018-10-22 DIAGNOSIS — R42 Dizziness and giddiness: Secondary | ICD-10-CM | POA: Diagnosis not present

## 2018-10-22 LAB — GLUCOSE, CAPILLARY: Glucose-Capillary: 81 mg/dL (ref 70–99)

## 2018-10-22 MED ORDER — MELOXICAM 15 MG PO TABS: 15.0000 mg | ORAL_TABLET | Freq: Every day | ORAL | 0 refills | Status: DC

## 2018-10-22 MED ORDER — MECLIZINE HCL 25 MG PO TABS: 25.0000 mg | ORAL_TABLET | Freq: Three times a day (TID) | ORAL | 0 refills | Status: DC | PRN

## 2018-10-22 NOTE — Telephone Encounter (Signed)
Edgardo Roys RN added this note;  Pt of Clarene Reamer triaged to ED; notified Shaquanna Lycan

## 2018-10-22 NOTE — Telephone Encounter (Signed)
Pt called with complaints of dizziness, lightheaded, and nausea that started this morning 10/22/2018 at 0600; she says that she is light headed every time she tries to stand up; the pt says that when she woke up this morning both her hands were numb but that happens sometimes depending on the way she sleeps; nurse triage initiated and recommendations made per nurse triage protocol; the pt normally sees [facility], [place]; notified Rena; will route to office for notification of this encounter.  Reason for Disposition . Patient sounds very sick or weak to the triager  Answer Assessment - Initial Assessment Questions 1. DESCRIPTION: "Describe your dizziness."     Light headed 2. LIGHTHEADED: "Do you feel lightheaded?" (e.g., somewhat faint, woozy, weak upon standing)     Weak upon standing 3. VERTIGO: "Do you feel like either you or the room is spinning or tilting?" (i.e. vertigo)     no 4. SEVERITY: "How bad is it?"  "Do you feel like you are going to faint?" "Can you stand and walk?"   - MILD - walking normally   - MODERATE - interferes with normal activities (e.g., work, school)    - SEVERE - unable to stand, requires support to walk, feels like passing out now.      Initially moderate but now mild 5. ONSET:  "When did the dizziness begin?"     10/22/2018 at 0600 6. AGGRAVATING FACTORS: "Does anything make it worse?" (e.g., standing, change in head position)     standing 7. HEART RATE: "Can you tell me your heart rate?" "How many beats in 15 seconds?"  (Note: not all patients can do this)       Pt can not complete this task 8. CAUSE: "What do you think is causing the dizziness?"     Not sure 9. RECURRENT SYMPTOM: "Have you had dizziness before?" If so, ask: "When was the last time?" "What happened that time?"     no 10. OTHER SYMPTOMS: "Do you have any other symptoms?" (e.g., fever, chest pain, vomiting, diarrhea, bleeding)       "cold"  11. PREGNANCY: "Is there any chance you  are pregnant?" "When was your last menstrual period?"       No menopause  Protocols used: DIZZINESS [facility]

## 2018-10-22 NOTE — ED Triage Notes (Signed)
Pt presents to [facility] for assessment of right shoulder pain for a few weeks now.  Denies known injury.  States she feels like "it's my big boobs pulling down on them"

## 2018-10-22 NOTE — ED Provider Notes (Signed)
[place]    CSN: [id number] Arrival date & time: 10/22/18  1605     History   Chief Complaint Chief Complaint  Patient presents with  . Shoulder Pain    HPI Wendy Wagner is a 53 y.o. female.   HPI  Patient is here with 2 complaints.  She needs a note for work because she had some dizziness when she got up this morning.  She states she felt sort of weak, the room was spinning, had some nausea.  Is gotten better as the day has progressed.  No cough cold runny nose sinus congestion or pain.  No fever.  Normal appetite.  She did not check her blood sugar.  She still has some mild fatigue and dizzy sensation, but it is much improved. She is also here because she is has right shoulder pain.  Is been present for a few weeks.  No injury.  No overuse.  No fall.  She states it bothers her at night.  She has not taken any medications or seen her physician for this.  The pain is in the shoulder joint and into the upper arm.  No numbness or weakness into the fingers.  No pain in the neck or pain with neck movement.  Past Medical History:  Diagnosis Date  . Anemia   . Back pain   . Childhood asthma   . GERD (gastroesophageal reflux disease)   . Heart murmur   . Leg edema     Patient Active Problem List   Diagnosis Date Noted  . Acute lumbar back pain 07/28/2018  . Abdominal wall mass 07/28/2018  . Vaginal lesion 03/12/2018  . Plantar fasciitis 02/23/2016  . Childhood asthma 02/09/2015  . GERD (gastroesophageal reflux disease) 02/09/2015    Past Surgical History:  Procedure Laterality Date  . CESAREAN SECTION    . WISDOM TOOTH EXTRACTION      OB History    Gravida  3   Para      Term      Preterm      AB      Living  3     SAB      TAB      Ectopic      Multiple      Live Births               Home Medications    Prior to Admission medications   Medication Sig Start Date End Date Taking? Authorizing Provider  metFORMIN (GLUCOPHAGE)  500 MG tablet Take 1 tablet (500 mg total) by mouth daily with breakfast. 09/22/18  Yes Abby Potash, PA-C  Vitamin D, Ergocalciferol, (DRISDOL) 1.25 MG (50000 UT) CAPS capsule Take 1 capsule (50,000 Units total) by mouth every 7 (seven) days. 09/22/18  Yes Abby Potash, PA-C  meclizine (ANTIVERT) 25 MG tablet Take 1 tablet (25 mg total) by mouth 3 (three) times daily as needed for dizziness. 10/22/18   Raylene Everts, MD  meloxicam (MOBIC) 15 MG tablet Take 1 tablet (15 mg total) by mouth daily. 10/22/18   Raylene Everts, MD  traMADol (ULTRAM) 50 MG tablet Take 50 mg by mouth every 6 (six) hours as needed.  05/21/17   [provider]  triamcinolone cream (KENALOG) 0.1 % Apply 1 application topically 2 (two) times daily. 07/28/18   Ria Bush, MD    Family History Family History  Problem Relation Age of Onset  . Cancer Mother  brain  . Cancer Father        brain, lung  . Alcoholism Father   . Diabetes Neg Hx   . Heart disease Neg Hx   . Stroke Neg Hx     Social History Social History   Tobacco Use  . Smoking status: Former Smoker    Packs/day: 1.00    Years: 8.00    Pack years: 8.00    Types: Cigarettes  . Smokeless tobacco: Never Used  Substance Use Topics  . Alcohol use: No    Alcohol/week: 0.0 standard drinks  . Drug use: No     Allergies   No known drug allergy   Review of Systems Review of Systems  Constitutional: Positive for fever. Negative for chills.  HENT: Negative for ear pain and sore throat.   Eyes: Negative for pain and visual disturbance.  Respiratory: Negative for cough and shortness of breath.   Cardiovascular: Negative for chest pain and palpitations.  Gastrointestinal: Positive for nausea. Negative for abdominal pain and vomiting.  Genitourinary: Negative for dysuria and hematuria.  Musculoskeletal: Positive for arthralgias. Negative for back pain.  Skin: Negative for color change and rash.  Neurological:  Positive for dizziness. Negative for seizures and syncope.  All other systems reviewed and are negative.    Physical Exam Triage Vital Signs ED Triage Vitals  Enc Vitals Group     BP 10/22/18 1704 (!) 141/95     Pulse Rate 10/22/18 1704 75     Resp 10/22/18 1704 18     Temp 10/22/18 1704 98.7 F (37.1 C)     Temp Source 10/22/18 1704 Oral     SpO2 10/22/18 1704 100 %     Weight --      Height --      Head Circumference --      Peak Flow --      Pain Score 10/22/18 1702 5     Pain Loc --      Pain Edu? --      Excl. in [place]? --    No data found.  Updated Vital Signs BP (!) 141/95 (BP Location: Left Arm)   Pulse 75   Temp 98.7 F (37.1 C) (Oral)   Resp 18   LMP 05/22/2016   SpO2 100%      Physical Exam Constitutional:      General: She is not in acute distress.    Appearance: She is well-developed. She is obese. She is not ill-appearing.  HENT:     Head: Normocephalic and atraumatic.     Right Ear: Tympanic membrane, ear canal and external ear normal.     Left Ear: Tympanic membrane and external ear normal.     Nose: Nose normal.     Mouth/Throat:     Mouth: Mucous membranes are moist.  Eyes:     Conjunctiva/sclera: Conjunctivae normal.     Pupils: Pupils are equal, round, and reactive to light.     Comments: No nystagmus  Neck:     Musculoskeletal: Normal range of motion and neck supple.  Cardiovascular:     Rate and Rhythm: Normal rate and regular rhythm.     Heart sounds: Normal heart sounds.  Pulmonary:     Effort: Pulmonary effort is normal. No respiratory distress.     Breath sounds: Normal breath sounds.  Abdominal:     General: There is no distension.     Palpations: Abdomen is soft.  Musculoskeletal: Normal range of motion.  Comments: Right shoulder has no tenderness.  Limited abduction.  She has impingement signs that are positive.  She has painful range of motion in the abducted position with limited internal and external rotation.  Distal  neurovascular is nonfocal  Lymphadenopathy:     Cervical: No cervical adenopathy.  Skin:    General: Skin is warm and dry.  Neurological:     General: No focal deficit present.     Mental Status: She is alert.  Psychiatric:        Mood and Affect: Mood normal.      UC Treatments / Results  Labs (all labs ordered are listed, but only abnormal results are displayed) Labs Reviewed  GLUCOSE, CAPILLARY  CBG MONITORING, ED    EKG None  Radiology No results found.  Procedures Procedures (including critical care time)  Medications Ordered in UC Medications - No data to display  Initial Impression / Assessment and Plan / UC Course  I have reviewed the triage vital signs and the nursing notes.  Pertinent labs & imaging results that were available during my care of the patient were reviewed by me and considered in my medical decision making (see chart for details).     I think her vague dizziness and nausea is probably a virus, with some vertigo.  Her blood sugar is good.  Vital signs are normal.  Physical exam is normal.  Her shoulder is more of a bursitis or rotator cuff tendinitis.  Discussed treatment. Final Clinical Impressions(s) / UC Diagnoses   Final diagnoses:  Acute pain of right shoulder  Acute bursitis of right shoulder  Dizziness     Discharge Instructions     Take meloxicam one time a day with food Do not take ibuprofen or aleve on the meloxicam Avoid heavy use of the arm  and overhead use  Push fluids Take meclizine for dizziness See your PCP if not improving by next week    ED Prescriptions    Medication Sig Dispense Auth. Provider   meloxicam (MOBIC) 15 MG tablet Take 1 tablet (15 mg total) by mouth daily. 15 tablet Raylene Everts, MD   meclizine (ANTIVERT) 25 MG tablet Take 1 tablet (25 mg total) by mouth 3 (three) times daily as needed for dizziness. 30 tablet Raylene Everts, MD     Controlled Substance Prescriptions [place] Controlled  Substance Registry consulted? Not Applicable   Raylene Everts, MD 10/22/18 1910

## 2018-10-22 NOTE — Discharge Instructions (Addendum)
Take meloxicam one time a day with food Do not take ibuprofen or aleve on the meloxicam Avoid heavy use of the arm  and overhead use  Push fluids Take meclizine for dizziness See your PCP if not improving by next week

## 2018-10-22 NOTE — Telephone Encounter (Signed)
Agree with advice given

## 2018-10-27 ENCOUNTER — Ambulatory Visit (INDEPENDENT_AMBULATORY_CARE_PROVIDER_SITE_OTHER): Payer: BLUE CROSS/BLUE SHIELD | Admitting: Physician Assistant

## 2018-10-27 ENCOUNTER — Encounter (INDEPENDENT_AMBULATORY_CARE_PROVIDER_SITE_OTHER): Payer: Self-pay | Admitting: Physician Assistant

## 2018-10-27 VITALS — BP 120/75 | HR 64 | Temp 97.9°F | Ht 67.0 in | Wt 321.0 lb

## 2018-10-27 DIAGNOSIS — R7303 Prediabetes: Secondary | ICD-10-CM

## 2018-10-27 DIAGNOSIS — Z9189 Other specified personal risk factors, not elsewhere classified: Secondary | ICD-10-CM

## 2018-10-27 DIAGNOSIS — E559 Vitamin D deficiency, unspecified: Secondary | ICD-10-CM

## 2018-10-27 DIAGNOSIS — Z6841 Body Mass Index (BMI) 40.0 and over, adult: Secondary | ICD-10-CM

## 2018-10-27 MED ORDER — METFORMIN HCL 500 MG PO TABS: 500.0000 mg | ORAL_TABLET | Freq: Every day | ORAL | 0 refills | Status: DC

## 2018-10-27 MED ORDER — VITAMIN D (ERGOCALCIFEROL) 1.25 MG (50000 UNIT) PO CAPS: 50000.0000 [IU] | ORAL_CAPSULE | ORAL | 0 refills | Status: DC

## 2018-10-27 NOTE — Progress Notes (Signed)
Office: [phone]  /  Fax: [phone]   HPI:   Chief Complaint: OBESITY Wendy Wagner is here to discuss her progress with her obesity treatment plan. She is on the Category 3 plan and is following her eating plan approximately 80-85 % of the time. She states she is walking 20 minutes 3 times per week. Wendy Wagner continues to struggle with getting all of her food in on the plan. She is ready to get back on track. Her weight is (!) 321 lb (145.6 kg) today and has lost 0 lbs since her last visit. She has lost 19 lbs since starting treatment with [place].  Pre-Diabetes [name] has a diagnosis of prediabetes based on her elevated Hgb A1c and was informed this puts her at greater risk of developing diabetes. She is taking metformin currently and continues to work on diet and exercise to decrease risk of diabetes. She denies nausea or hypoglycemia. She denies polyphagia.  Vitamin D deficiency [name] has a diagnosis of vitamin D deficiency. She is currently taking prescription Vit D and denies nausea, vomiting or muscle weakness.  At risk for diabetes Wendy Wagner is at higher than average risk for developing diabetes due to her obesity. She currently denies polyuria or polydipsia.   ASSESSMENT AND PLAN:  Prediabetes - Plan: metFORMIN (GLUCOPHAGE) 500 MG tablet  Vitamin D deficiency - Plan: Vitamin D, Ergocalciferol, (DRISDOL) 1.25 MG (50000 UT) CAPS capsule  At risk for diabetes mellitus  Class 3 severe obesity with serious comorbidity and body mass index (BMI) of 50.0 to 59.9 in adult, unspecified obesity type [facility])  PLAN:  Pre-Diabetes Wendy Wagner will continue to work on weight loss, exercise, and decreasing simple carbohydrates in her diet to help decrease the risk of diabetes. We dicussed metformin including benefits and risks. She was informed that eating too many simple carbohydrates or too many calories at one sitting increases the likelihood of GI side effects. Wendy Wagner agrees to continue  taking metformin 500 mg qd #30 with no refills for now and a prescription was written today. [name] agreed to follow up with our clinic in 3 weeks  Vitamin D Deficiency Wendy Wagner was informed that low vitamin D levels contributes to fatigue and are associated with obesity, breast, and colon cancer. She agrees to continue to take prescription Vit D @50 ,000 IU every week #4 with no refills and will follow up for routine testing of vitamin D, at least 2-3 times per year. She was informed of the risk of over-replacement of vitamin D and agrees to not increase her dose unless she discusses this with [place] first. Wendy Wagner agreed to follow up with our clinic in 3 weeks  Obesity Wendy Wagner is currently in the action stage of change. As such, her goal is to continue with weight loss efforts She has agreed to follow the Category 3 plan Wendy Wagner has been instructed to work up to a goal of 150 minutes of combined cardio and strengthening exercise per week for weight loss and overall health benefits. We discussed the following Behavioral Modification Strategies today: work on meal planning and easy cooking plans and no skipping meals  Wendy Wagner has agreed to follow up with our clinic in 3 weeks. She was informed of the importance of frequent follow up visits to maximize her success with intensive lifestyle modifications for her multiple health conditions.  ALLERGIES: Allergies  Allergen Reactions  . No Known Drug Allergy     MEDICATIONS: Current Outpatient Medications on File Prior to Visit  Medication Sig Dispense Refill  .  meclizine (ANTIVERT) 25 MG tablet Take 1 tablet (25 mg total) by mouth 3 (three) times daily as needed for dizziness. 30 tablet 0  . meloxicam (MOBIC) 15 MG tablet Take 1 tablet (15 mg total) by mouth daily. 15 tablet 0  . traMADol (ULTRAM) 50 MG tablet Take 50 mg by mouth every 6 (six) hours as needed.   0  . triamcinolone cream (KENALOG) 0.1 % Apply 1 application topically 2 (two) times  daily. 30 g 0   No current facility-administered medications on file prior to visit.     PAST MEDICAL HISTORY: Past Medical History:  Diagnosis Date  . Anemia   . Back pain   . Childhood asthma   . GERD (gastroesophageal reflux disease)   . Heart murmur   . Leg edema     PAST SURGICAL HISTORY: Past Surgical History:  Procedure Laterality Date  . CESAREAN SECTION    . WISDOM TOOTH EXTRACTION      SOCIAL HISTORY: Social History   Tobacco Use  . Smoking status: Former Smoker    Packs/day: 1.00    Years: 8.00    Pack years: 8.00    Types: Cigarettes  . Smokeless tobacco: Never Used  Substance Use Topics  . Alcohol use: No    Alcohol/week: 0.0 standard drinks  . Drug use: No    FAMILY HISTORY: Family History  Problem Relation Age of Onset  . Cancer Mother        brain  . Cancer Father        brain, lung  . Alcoholism Father   . Diabetes Neg Hx   . Heart disease Neg Hx   . Stroke Neg Hx     ROS: Review of Systems  Constitutional: Negative for weight loss.  Gastrointestinal: Negative for diarrhea, nausea and vomiting.  Genitourinary:       Negative for polydipsia   Musculoskeletal:       Negative for muscle weakness  Endo/Heme/Allergies: Negative for polydipsia.       Negative for hypoglycemia Negative for polyphagia    PHYSICAL EXAM: Blood pressure 120/75, pulse 64, temperature 97.9 F (36.6 C), temperature source Oral, height 5\' 7"  (1.702 m), weight (!) 321 lb (145.6 kg), last menstrual period 05/22/2016, SpO2 100 %. Body mass index is 50.28 kg/m. Physical Exam  RECENT LABS AND TESTS: BMET    Component Value Date/Time   NA 143 06/04/2018 0938   K 4.5 06/04/2018 0938   CL 104 06/04/2018 0938   CO2 22 06/04/2018 0938   GLUCOSE 95 06/04/2018 0938   GLUCOSE 90 05/30/2017 1510   BUN 11 06/04/2018 0938   CREATININE 0.89 06/04/2018 0938   CALCIUM 9.1 06/04/2018 0938   GFRNONAA 74 06/04/2018 0938   GFRAA 85 06/04/2018 0938   Lab Results    Component Value Date   HGBA1C 5.8 (H) 06/04/2018   HGBA1C 5.9 (H) 01/28/2018   HGBA1C 6.2 05/30/2017   HGBA1C 6.2 05/29/2016   HGBA1C 5.8 02/23/2015   Lab Results  Component Value Date   INSULIN 21.9 06/04/2018   INSULIN 20.4 01/28/2018   CBC    Component Value Date/Time   WBC 4.3 01/28/2018 1147   WBC 4.8 05/30/2017 1510   RBC 4.57 01/28/2018 1147   RBC 4.40 05/30/2017 1510   HGB 12.2 01/28/2018 1147   HCT 39.4 01/28/2018 1147   PLT 310.0 05/30/2017 1510   MCV 86 01/28/2018 1147   MCH 26.7 01/28/2018 1147   MCHC 31.0 (L) 01/28/2018 1147  MCHC 31.8 05/30/2017 1510   RDW 14.9 01/28/2018 1147   LYMPHSABS 2.0 01/28/2018 1147   EOSABS 0.2 01/28/2018 1147   BASOSABS 0.1 01/28/2018 1147   Iron/TIBC/Ferritin/ %Sat No results found for: IRON, TIBC, FERRITIN, IRONPCTSAT Lipid Panel     Component Value Date/Time   CHOL 174 06/04/2018 0938   TRIG 91 06/04/2018 0938   HDL 67 06/04/2018 0938   CHOLHDL 3 05/30/2017 1510   VLDL 24.8 05/30/2017 1510   LDLCALC 89 06/04/2018 0938   Hepatic Function Panel     Component Value Date/Time   PROT 6.8 06/04/2018 0938   ALBUMIN 4.0 06/04/2018 0938   AST 11 06/04/2018 0938   ALT 12 06/04/2018 0938   ALKPHOS 77 06/04/2018 0938   BILITOT 0.3 06/04/2018 0938      Component Value Date/Time   TSH 0.888 01/28/2018 1147   TSH 0.79 02/23/2015 1512    Ref. Range 06/04/2018 09:38  Vitamin D, 25-Hydroxy Latest Ref Range: 30.0 - 100.0 ng/mL 26.3 (L)     OBESITY BEHAVIORAL INTERVENTION VISIT  Today's visit was # 14   Starting weight: 340 lbs Starting date: 01/28/2018 Today's weight :: (!) 321 lbs Today's date: 10/27/2018 Total lbs lost to date:19  ASK: We discussed the diagnosis of obesity with Albin Fischer today and Mirela agreed to give [place] permission to discuss obesity behavioral modification therapy today.  ASSESS: Tyshia has the diagnosis of obesity and her BMI today is 50.26 Rayn is in the action stage of change    ADVISE: Ebany was educated on the multiple health risks of obesity as well as the benefit of weight loss to improve her health. She was advised of the need for long term treatment and the importance of lifestyle modifications to improve her current health and to decrease her risk of future health problems.  AGREE: Multiple dietary modification options and treatment options were discussed and  Tamla agreed to follow the recommendations documented in the above note.  ARRANGE: Genevie was educated on the importance of frequent visits to treat obesity as outlined per CMS and USPSTF guidelines and agreed to schedule her next follow up appointment today.  I, Tammy Wysor, am acting as Location manager for [employer] I, Abby Potash, PA-C have reviewed above note and agree with its content

## 2018-10-29 ENCOUNTER — Ambulatory Visit (INDEPENDENT_AMBULATORY_CARE_PROVIDER_SITE_OTHER): Payer: BLUE CROSS/BLUE SHIELD | Admitting: Internal Medicine

## 2018-10-29 ENCOUNTER — Encounter: Payer: Self-pay | Admitting: Internal Medicine

## 2018-10-29 VITALS — BP 124/86 | HR 79 | Temp 97.9°F | Wt 326.0 lb

## 2018-10-29 DIAGNOSIS — R51 Headache: Secondary | ICD-10-CM

## 2018-10-29 DIAGNOSIS — M7551 Bursitis of right shoulder: Secondary | ICD-10-CM | POA: Diagnosis not present

## 2018-10-29 DIAGNOSIS — R42 Dizziness and giddiness: Secondary | ICD-10-CM

## 2018-10-29 DIAGNOSIS — Z87898 Personal history of other specified conditions: Secondary | ICD-10-CM | POA: Diagnosis not present

## 2018-10-29 DIAGNOSIS — R519 Headache, unspecified: Secondary | ICD-10-CM

## 2018-10-29 MED ORDER — TRIAMCINOLONE ACETONIDE 0.1 % EX CREA: 1.0000 "application " | TOPICAL_CREAM | Freq: Two times a day (BID) | CUTANEOUS | 0 refills | Status: DC

## 2018-10-29 NOTE — Patient Instructions (Signed)
General Headache Without Cause A headache is pain or discomfort that is felt around the head or neck area. There are many causes and types of headaches. In some cases, the cause may not be found. Follow these instructions at home: Watch your condition for any changes. Let your doctor know about them. Take these steps to help with your condition: Managing pain      Take over-the-counter and prescription medicines only as told by your doctor.  Lie down in a dark, quiet room when you have a headache.  If told, put ice on your head and neck area: ? Put ice in a plastic bag. ? Place a towel between your skin and the bag. ? Leave the ice on for 20 minutes, 2-3 times per day.  If told, put heat on the affected area. Use the heat source that your doctor recommends, such as a moist heat pack or a heating pad. ? Place a towel between your skin and the heat source. ? Leave the heat on for 20-30 minutes. ? Remove the heat if your skin turns bright red. This is very important if you are unable to feel pain, heat, or cold. You may have a greater risk of getting burned.  Keep lights dim if bright lights bother you or make your headaches worse. Eating and drinking  Eat meals on a regular schedule.  If you drink alcohol: ? Limit how much you use to:  0-1 drink a day for women.  0-2 drinks a day for men. ? Be aware of how much alcohol is in your drink. In the [place] one drink equals one 12 oz bottle of beer (355 mL), one 5 oz glass of wine (148 mL), or one 1 oz glass of hard liquor (44 mL).  Stop drinking caffeine, or reduce how much caffeine you drink. General instructions   Keep a journal to find out if certain things bring on headaches. For example, write down: ? What you eat and drink. ? How much sleep you get. ? Any change to your diet or medicines.  Get a massage or try other ways to relax.  Limit stress.  Sit up straight. Do not tighten (tense) your muscles.  Do not use any  products that contain nicotine or tobacco. This includes cigarettes, e-cigarettes, and chewing tobacco. If you need help quitting, ask your doctor.  Exercise regularly as told by your doctor.  Get enough sleep. This often means 7-9 hours of sleep each night.  Keep all follow-up visits as told by your doctor. This is important. Contact a doctor if:  Your symptoms are not helped by medicine.  You have a headache that feels different than the other headaches.  You feel sick to your stomach (nauseous) or you throw up (vomit).  You have a fever. Get help right away if:  Your headache gets very bad quickly.  Your headache gets worse after a lot of physical activity.  You keep throwing up.  You have a stiff neck.  You have trouble seeing.  You have trouble speaking.  You have pain in the eye or ear.  Your muscles are weak or you lose muscle control.  You lose your balance or have trouble walking.  You feel like you will pass out (faint) or you pass out.  You are mixed up (confused).  You have a seizure. Summary  A headache is pain or discomfort that is felt around the head or neck area.  There are many causes and   types of headaches. In some cases, the cause may not be found.  Keep a journal to help find out what causes your headaches. Watch your condition for any changes. Let your doctor know about them.  Contact a doctor if you have a headache that is different from usual, or if your headache is not helped by medicine.  Get help right away if your headache gets very bad, you throw up, you have trouble seeing, you lose your balance, or you have a seizure. This information is not intended to replace advice given to you by your health care provider. Make sure you discuss any questions you have with your health care provider. Document Released: 07/02/2008 Document Revised: 04/13/2018 Document Reviewed: 04/13/2018 Elsevier Interactive Patient Education  2019 [employer]  

## 2018-10-29 NOTE — Progress Notes (Signed)
Subjective:    Patient ID: Wendy Wagner, female    DOB: 1966/03/19, 53 y.o.   MRN: [id number]  HPI  Patient presents to the clinic today for urgent care follow-up.  She went to urgent care 1/16 with complaint of dizziness, nausea and right shoulder pain.  She described the dizziness as a sensation that the room was spinning.  She denied headache or visual changes.  She described the right shoulder pain is sore and achy.  No x-ray was done.  She was diagnosed with vertigo and bursitis of the right shoulder.  She was given prescriptions for Meloxicam and Meclizine and advised to follow-up with PCP. Since discharge, she reports she has not had any more dizziness and the shoulder pain is improving. She reports she has been having headaches for the last 2 days. She is unsure of the location of the headaches. They are intermittent. She is unable to describe what they feel like. She denies associated visual changes, dizziness, sensitivity to light or sound, nausea or vomiting. She denies runny nose, nasal congestion, ear pain or sore throat. BP today is normal. She has not taken anything OTC for this.  Review of Systems      Past Medical History:  Diagnosis Date  . Anemia   . Back pain   . Childhood asthma   . GERD (gastroesophageal reflux disease)   . Heart murmur   . Leg edema     Current Outpatient Medications  Medication Sig Dispense Refill  . meclizine (ANTIVERT) 25 MG tablet Take 1 tablet (25 mg total) by mouth 3 (three) times daily as needed for dizziness. 30 tablet 0  . meloxicam (MOBIC) 15 MG tablet Take 1 tablet (15 mg total) by mouth daily. 15 tablet 0  . metFORMIN (GLUCOPHAGE) 500 MG tablet Take 1 tablet (500 mg total) by mouth daily with breakfast. 30 tablet 0  . traMADol (ULTRAM) 50 MG tablet Take 50 mg by mouth every 6 (six) hours as needed.   0  . triamcinolone cream (KENALOG) 0.1 % Apply 1 application topically 2 (two) times daily. 30 g 0  . Vitamin D, Ergocalciferol,  (DRISDOL) 1.25 MG (50000 UT) CAPS capsule Take 1 capsule (50,000 Units total) by mouth every 7 (seven) days. 4 capsule 0   No current facility-administered medications for this visit.     Allergies  Allergen Reactions  . No Known Drug Allergy     Family History  Problem Relation Age of Onset  . Cancer Mother        brain  . Cancer Father        brain, lung  . Alcoholism Father   . Diabetes Neg Hx   . Heart disease Neg Hx   . Stroke Neg Hx     Social History   Socioeconomic History  . Marital status: Single    Spouse name: Not on file  . Number of children: Not on file  . Years of education: Not on file  . Highest education level: Not on file  Occupational History  . Occupation: [employer]  . Financial resource strain: Not on file  . Food insecurity:    Worry: Not on file    Inability: Not on file  . Transportation needs:    Medical: Not on file    Non-medical: Not on file  Tobacco Use  . Smoking status: Former Smoker    Packs/day: 1.00    Years: 8.00    Pack years: 8.00  Types: Cigarettes  . Smokeless tobacco: Never Used  Substance and Sexual Activity  . Alcohol use: No    Alcohol/week: 0.0 standard drinks  . Drug use: No  . Sexual activity: Not Currently  Lifestyle  . Physical activity:    Days per week: Not on file    Minutes per session: Not on file  . Stress: Not on file  Relationships  . Social connections:    Talks on phone: Not on file    Gets together: Not on file    Attends religious service: Not on file    Active member of club or organization: Not on file    Attends meetings of clubs or organizations: Not on file    Relationship status: Not on file  . Intimate partner violence:    Fear of current or ex partner: Not on file    Emotionally abused: Not on file    Physically abused: Not on file    Forced sexual activity: Not on file  Other Topics Concern  . Not on file  Social History Narrative  . Not on file      Constitutional: Pt reports intermittent headaches. Denies fever, malaise, fatigue, or abrupt weight changes.  HEENT: Denies eye pain, eye redness, ear pain, ringing in the ears, wax buildup, runny nose, nasal congestion, bloody nose, or sore throat. Respiratory: Denies difficulty breathing, shortness of breath, cough or sputum production.   Cardiovascular: Denies chest pain, chest tightness, palpitations or swelling in the hands or feet.  Gastrointestinal: Denies abdominal pain, bloating, constipation, diarrhea or blood in the stool.  Musculoskeletal: Pt reports improving right shoulder pain. Denies decrease in range of motion, difficulty with gait, muscle pain or joint  swelling.    No other specific complaints in a complete review of systems (except as listed in HPI above).  Objective:   Physical Exam   BP 124/86   Pulse 79   Temp 97.9 F (36.6 C) (Oral)   Wt (!) 326 lb (147.9 kg)   LMP 05/22/2016   SpO2 98%   BMI 51.06 kg/m  Wt Readings from Last 3 Encounters:  10/29/18 (!) 326 lb (147.9 kg)  10/27/18 (!) 321 lb (145.6 kg)  09/22/18 (!) 316 lb (143.3 kg)    General: Appears her stated age, obese, in NAD. HEENT: Head: normal shape and size, no sinus tenderness noted; Ears: Tm's gray and intact, normal light reflex; Nose: mucosa pink and moist, septum midline; Throat/Mouth: Teeth present, mucosa pink and moist, no exudate, lesions or ulcerations noted.  Cardiovascular: Normal rate and rhythm. S1,S2 noted.  Murmur noted. No JVD or BLE edema.  Pulmonary/Chest: Normal effort and positive vesicular breath sounds. No respiratory distress. No wheezes, rales or ronchi noted.  Musculoskeletal: Normal internal and external rotation of the right shoulder. Pain with palpation over the right subacromial bursa. Strength 5/5 BUE. Neurological: Alert and oriented. Coordination normal.    BMET    Component Value Date/Time   NA 143 06/04/2018 0938   K 4.5 06/04/2018 0938   CL 104  06/04/2018 0938   CO2 22 06/04/2018 0938   GLUCOSE 95 06/04/2018 0938   GLUCOSE 90 05/30/2017 1510   BUN 11 06/04/2018 0938   CREATININE 0.89 06/04/2018 0938   CALCIUM 9.1 06/04/2018 0938   GFRNONAA 74 06/04/2018 0938   GFRAA 85 06/04/2018 0938    Lipid Panel     Component Value Date/Time   CHOL 174 06/04/2018 0938   TRIG 91 06/04/2018 0938   HDL  67 06/04/2018 0938   CHOLHDL 3 05/30/2017 1510   VLDL 24.8 05/30/2017 1510   LDLCALC 89 06/04/2018 0938    CBC    Component Value Date/Time   WBC 4.3 01/28/2018 1147   WBC 4.8 05/30/2017 1510   RBC 4.57 01/28/2018 1147   RBC 4.40 05/30/2017 1510   HGB 12.2 01/28/2018 1147   HCT 39.4 01/28/2018 1147   PLT 310.0 05/30/2017 1510   MCV 86 01/28/2018 1147   MCH 26.7 01/28/2018 1147   MCHC 31.0 (L) 01/28/2018 1147   MCHC 31.8 05/30/2017 1510   RDW 14.9 01/28/2018 1147   LYMPHSABS 2.0 01/28/2018 1147   EOSABS 0.2 01/28/2018 1147   BASOSABS 0.1 01/28/2018 1147    Hgb A1C Lab Results  Component Value Date   HGBA1C 5.8 (H) 06/04/2018           Assessment & Plan:   UC follow up for Vertigo, Subacromial Bursitis of Right Shoulder, Headaches:  UC notes reviewed Dizziness resolved Advised her to make sure she is consuming adequate amounts of water Shoulder pain improving Continue Meloxicam Can take Tylenol 1000 mg TID prn for headaches  Return precautions discussed Webb Silversmith, NP

## 2018-11-13 ENCOUNTER — Telehealth: Payer: Self-pay | Admitting: Internal Medicine

## 2018-11-13 MED ORDER — MELOXICAM 15 MG PO TABS: 15.0000 mg | ORAL_TABLET | Freq: Every day | ORAL | 5 refills | Status: DC

## 2018-11-13 NOTE — Addendum Note (Signed)
Addended by: Jearld Fenton on: 11/13/2018 02:00 PM   Modules accepted: Orders

## 2018-11-13 NOTE — Telephone Encounter (Signed)
Patient received Meloxicam when she was seen at urgent care for arm pain.  Patient said she followed up with Rollene Fare after she was seen at urgent care.  Patient said she needs a refill. Patient said she asked her pharmacy to send the refill request to [place] two days ago. Patient uses [facility].

## 2018-11-13 NOTE — Telephone Encounter (Signed)
RX sent to pharmacy  

## 2018-11-17 ENCOUNTER — Encounter (INDEPENDENT_AMBULATORY_CARE_PROVIDER_SITE_OTHER): Payer: Self-pay | Admitting: Physician Assistant

## 2018-11-17 ENCOUNTER — Ambulatory Visit (INDEPENDENT_AMBULATORY_CARE_PROVIDER_SITE_OTHER): Payer: BLUE CROSS/BLUE SHIELD | Admitting: Physician Assistant

## 2018-11-17 VITALS — BP 122/79 | HR 70 | Temp 98.2°F | Ht 67.0 in | Wt 322.0 lb

## 2018-11-17 DIAGNOSIS — Z9189 Other specified personal risk factors, not elsewhere classified: Secondary | ICD-10-CM | POA: Diagnosis not present

## 2018-11-17 DIAGNOSIS — Z6841 Body Mass Index (BMI) 40.0 and over, adult: Secondary | ICD-10-CM

## 2018-11-17 DIAGNOSIS — R7303 Prediabetes: Secondary | ICD-10-CM

## 2018-11-17 DIAGNOSIS — F3289 Other specified depressive episodes: Secondary | ICD-10-CM | POA: Diagnosis not present

## 2018-11-17 DIAGNOSIS — E559 Vitamin D deficiency, unspecified: Secondary | ICD-10-CM

## 2018-11-17 MED ORDER — METFORMIN HCL 500 MG PO TABS: 500.0000 mg | ORAL_TABLET | Freq: Every day | ORAL | 0 refills | Status: DC

## 2018-11-17 MED ORDER — VITAMIN D (ERGOCALCIFEROL) 1.25 MG (50000 UNIT) PO CAPS: 50000.0000 [IU] | ORAL_CAPSULE | ORAL | 0 refills | Status: DC

## 2018-11-17 NOTE — Progress Notes (Signed)
Office: [phone]  /  Fax: [phone]   HPI:   Chief Complaint: OBESITY Wendy Wagner is here to discuss her progress with her obesity treatment plan. She is on the Category 3 plan and is following her eating plan approximately 60% of the time. She states she is exercising 0 minutes 0 times per week. Wendy Wagner reports that she is having a hard time getting all of her protein in. She also states that she is doing some stress eating. She is eating salads for lunch and finds that she is not hungry for dinner. Her weight is (!) 322 lb (146.1 kg) today and has had a weight gain of 1 pound since her last visit. She has lost 18 lbs since starting treatment with [place].  Vitamin D deficiency Wendy Wagner has a diagnosis of Vitamin D deficiency. She is currently taking prescription Vit D and denies nausea, vomiting or muscle weakness.  Pre-Diabetes Wendy Wagner has a diagnosis of prediabetes based on her elevated HgA1c and was informed this puts her at greater risk of developing diabetes. She is taking metformin currently and continues to work on diet and exercise to decrease risk of diabetes. She denies nausea, vomiting, or diarrhea on metformin. Denies polyphagia or hypoglycemia.  Depression with emotional eating behaviors Wendy Wagner is under a lot of stress and is doing a lot of stress/emotional eating.  Depression screen [facility] 2/9 01/28/2018 05/30/2017 02/09/2015  Decreased Interest 3 0 0  Down, Depressed, Hopeless 1 0 0  PHQ - 2 Score 4 0 0  Altered sleeping 3 - -  Tired, decreased energy 3 - -  Change in appetite 2 - -  Feeling bad or failure about yourself  0 - -  Trouble concentrating 0 - -  Moving slowly or fidgety/restless 0 - -  Suicidal thoughts 0 - -  PHQ-9 Score 12 - -  Difficult doing work/chores Somewhat difficult - -   At risk for osteopenia and osteoporosis Wendy Wagner is at higher risk of osteopenia and osteoporosis due to Vitamin D deficiency.   ASSESSMENT AND PLAN:  Prediabetes - Plan:  metFORMIN (GLUCOPHAGE) 500 MG tablet  Vitamin D deficiency - Plan: Vitamin D, Ergocalciferol, (DRISDOL) 1.25 MG (50000 UT) CAPS capsule  Other depression - with emotional eating  At risk for osteoporosis  Class 3 severe obesity with serious comorbidity and body mass index (BMI) of 50.0 to 59.9 in adult, unspecified obesity type ([place])  PLAN:  Vitamin D Deficiency Wendy Wagner was informed that low Vitamin D levels contributes to fatigue and are associated with obesity, breast, and colon cancer. She agrees to continue to take prescription Vit D @ 50,000 IU every week #4 with no refills and will follow-up for routine testing of Vitamin D, at least 2-3 times per year. She was informed of the risk of over-replacement of Vitamin D and agrees to not increase her dose unless she discusses this with [place] first. Wendy Wagner agrees to follow-up with our clinic in 3 weeks.  Pre-Diabetes [name] will continue to work on weight loss, exercise, and decreasing simple carbohydrates in her diet to help decrease the risk of diabetes. We dicussed metformin including benefits and risks. She was informed that eating too many simple carbohydrates or too many calories at one sitting increases the likelihood of GI side effects. Wendy Wagner is on metformin for now and a prescription was written today for #30 with no refills. Jnaya agrees to follow-up with our clinic in 3 weeks.  Depression with Emotional Eating Behaviors We discussed behavior modification techniques  today to help Wendy Wagner deal with her emotional eating and depression. [name] will be referred to Dr. Mallie Mussel.  At risk for osteopenia and osteoporosis Wendy Wagner was given extended  (15 minutes) osteoporosis prevention counseling today. Wendy Wagner is at risk for osteopenia and osteoporsis due to her Vitamin D deficiency. She was encouraged to take her Vitamin D and follow her higher calcium diet and increase strengthening exercise to help strengthen her bones and decrease her  risk of osteopenia and osteoporosis.  Obesity Wendy Wagner is currently in the action stage of change. As such, her goal is to continue with weight loss efforts. She has agreed to follow the Category 3 plan. Wendy Wagner has been instructed to work up to a goal of 150 minutes of combined cardio and strengthening exercise per week for weight loss and overall health benefits. We discussed the following Behavioral Modification Strategies today: increasing lean protein intake and work on meal planning and easy cooking plans.  Wendy Wagner has agreed to follow up with our clinic in 3 weeks. She was informed of the importance of frequent follow up visits to maximize her success with intensive lifestyle modifications for her multiple health conditions.  ALLERGIES: Allergies  Allergen Reactions  . No Known Drug Allergy     MEDICATIONS: Current Outpatient Medications on File Prior to Visit  Medication Sig Dispense Refill  . meclizine (ANTIVERT) 25 MG tablet Take 1 tablet (25 mg total) by mouth 3 (three) times daily as needed for dizziness. 30 tablet 0  . meloxicam (MOBIC) 15 MG tablet Take 1 tablet (15 mg total) by mouth daily. 30 tablet 5  . traMADol (ULTRAM) 50 MG tablet Take 50 mg by mouth every 6 (six) hours as needed.   0  . triamcinolone cream (KENALOG) 0.1 % Apply 1 application topically 2 (two) times daily. 30 g 0   No current facility-administered medications on file prior to visit.     PAST MEDICAL HISTORY: Past Medical History:  Diagnosis Date  . Anemia   . Back pain   . Childhood asthma   . GERD (gastroesophageal reflux disease)   . Heart murmur   . Leg edema     PAST SURGICAL HISTORY: Past Surgical History:  Procedure Laterality Date  . CESAREAN SECTION    . WISDOM TOOTH EXTRACTION      SOCIAL HISTORY: Social History   Tobacco Use  . Smoking status: Former Smoker    Packs/day: 1.00    Years: 8.00    Pack years: 8.00    Types: Cigarettes  . Smokeless tobacco: Never Used    Substance Use Topics  . Alcohol use: No    Alcohol/week: 0.0 standard drinks  . Drug use: No    FAMILY HISTORY: Family History  Problem Relation Age of Onset  . Cancer Mother        brain  . Cancer Father        brain, lung  . Alcoholism Father   . Diabetes Neg Hx   . Heart disease Neg Hx   . Stroke Neg Hx    ROS: Review of Systems  Constitutional: Negative for weight loss.  Gastrointestinal: Negative for diarrhea, nausea and vomiting.  Musculoskeletal:       Negative for muscle weakness.  Endo/Heme/Allergies:       Negative for polyphagia. Negative for hypoglycemia.   PHYSICAL EXAM: Blood pressure 122/79, pulse 70, temperature 98.2 F (36.8 C), temperature source Oral, height 5\' 7"  (1.702 m), weight (!) 322 lb (146.1 kg), last menstrual period  05/22/2016, SpO2 99 %. Body mass index is 50.43 kg/m. Physical Exam Vitals signs reviewed.  Constitutional:      Appearance: Normal appearance. She is obese.  Cardiovascular:     Rate and Rhythm: Normal rate.     Pulses: Normal pulses.  Pulmonary:     Effort: Pulmonary effort is normal.     Breath sounds: Normal breath sounds.  Musculoskeletal: Normal range of motion.  Skin:    General: Skin is warm and dry.  Neurological:     Mental Status: She is alert and oriented to person, place, and time.  Psychiatric:        Behavior: Behavior normal.   RECENT LABS AND TESTS: BMET    Component Value Date/Time   NA 143 06/04/2018 0938   K 4.5 06/04/2018 0938   CL 104 06/04/2018 0938   CO2 22 06/04/2018 0938   GLUCOSE 95 06/04/2018 0938   GLUCOSE 90 05/30/2017 1510   BUN 11 06/04/2018 0938   CREATININE 0.89 06/04/2018 0938   CALCIUM 9.1 06/04/2018 0938   GFRNONAA 74 06/04/2018 0938   GFRAA 85 06/04/2018 0938   Lab Results  Component Value Date   HGBA1C 5.8 (H) 06/04/2018   HGBA1C 5.9 (H) 01/28/2018   HGBA1C 6.2 05/30/2017   HGBA1C 6.2 05/29/2016   HGBA1C 5.8 02/23/2015   Lab Results  Component Value Date    INSULIN 21.9 06/04/2018   INSULIN 20.4 01/28/2018   CBC    Component Value Date/Time   WBC 4.3 01/28/2018 1147   WBC 4.8 05/30/2017 1510   RBC 4.57 01/28/2018 1147   RBC 4.40 05/30/2017 1510   HGB 12.2 01/28/2018 1147   HCT 39.4 01/28/2018 1147   PLT 310.0 05/30/2017 1510   MCV 86 01/28/2018 1147   MCH 26.7 01/28/2018 1147   MCHC 31.0 (L) 01/28/2018 1147   MCHC 31.8 05/30/2017 1510   RDW 14.9 01/28/2018 1147   LYMPHSABS 2.0 01/28/2018 1147   EOSABS 0.2 01/28/2018 1147   BASOSABS 0.1 01/28/2018 1147   Iron/TIBC/Ferritin/ %Sat No results found for: IRON, TIBC, FERRITIN, IRONPCTSAT Lipid Panel     Component Value Date/Time   CHOL 174 06/04/2018 0938   TRIG 91 06/04/2018 0938   HDL 67 06/04/2018 0938   CHOLHDL 3 05/30/2017 1510   VLDL 24.8 05/30/2017 1510   LDLCALC 89 06/04/2018 0938   Hepatic Function Panel     Component Value Date/Time   PROT 6.8 06/04/2018 0938   ALBUMIN 4.0 06/04/2018 0938   AST 11 06/04/2018 0938   ALT 12 06/04/2018 0938   ALKPHOS 77 06/04/2018 0938   BILITOT 0.3 06/04/2018 0938      Component Value Date/Time   TSH 0.888 01/28/2018 1147   TSH 0.79 02/23/2015 1512   Results for MARELI, ANTUNES (MRN [id number]) as of 11/17/2018 16:15  Ref. Range 06/04/2018 09:38  Vitamin D, 25-Hydroxy Latest Ref Range: 30.0 - 100.0 ng/mL 26.3 (L)   OBESITY BEHAVIORAL INTERVENTION VISIT  Today's visit was #15  Starting weight: 340 lbs Starting date: 01/28/2018 Today's weight: 322 lbs  Today's date: 11/17/2018 Total lbs lost to date: 24  ASK: We discussed the diagnosis of obesity with Albin Fischer today and Roseanna agreed to give [place] permission to discuss obesity behavioral modification therapy today.  ASSESS: Merriel has the diagnosis of obesity and her BMI today is @ 50.57. Opie is in the action stage of change.   ADVISE: Nesa was educated on the multiple health risks of obesity as well as the  benefit of weight loss to improve her health. She  was advised of the need for long term treatment and the importance of lifestyle modifications to improve her current health and to decrease her risk of future health problems.  AGREE: Multiple dietary modification options and treatment options were discussed and  Porcha agreed to follow the recommendations documented in the above note.  ARRANGE: Maebry was educated on the importance of frequent visits to treat obesity as outlined per CMS and USPSTF guidelines and agreed to schedule her next follow up appointment today.  Migdalia Dk, am acting as transcriptionist for Abby Potash, PA-C I, Abby Potash, PA-C have reviewed above note and agree with its content
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
## 2018-12-08 ENCOUNTER — Encounter (INDEPENDENT_AMBULATORY_CARE_PROVIDER_SITE_OTHER): Payer: Self-pay | Admitting: Dietician

## 2018-12-08 ENCOUNTER — Ambulatory Visit (INDEPENDENT_AMBULATORY_CARE_PROVIDER_SITE_OTHER): Payer: BLUE CROSS/BLUE SHIELD | Admitting: Psychology

## 2018-12-08 ENCOUNTER — Ambulatory Visit (INDEPENDENT_AMBULATORY_CARE_PROVIDER_SITE_OTHER): Payer: BLUE CROSS/BLUE SHIELD | Admitting: Dietician

## 2018-12-08 VITALS — Ht 67.0 in | Wt 324.0 lb

## 2018-12-08 DIAGNOSIS — F3289 Other specified depressive episodes: Secondary | ICD-10-CM

## 2018-12-08 DIAGNOSIS — Z9189 Other specified personal risk factors, not elsewhere classified: Secondary | ICD-10-CM

## 2018-12-08 DIAGNOSIS — Z6841 Body Mass Index (BMI) 40.0 and over, adult: Secondary | ICD-10-CM

## 2018-12-08 DIAGNOSIS — E66813 Obesity, class 3: Secondary | ICD-10-CM

## 2018-12-08 DIAGNOSIS — R7303 Prediabetes: Secondary | ICD-10-CM

## 2018-12-08 NOTE — Progress Notes (Signed)
  Office: [phone]  /  Fax: [phone]     Wendy Wagner has a diagnosis of prediabetes based on her elevated Hgb A1c of 5.8. Although Wendy Wagner's blood glucose readings are still under good control, prediabetes puts her at greater risk of metabolic syndrome and diabetes.    Patient was educated about food nutrients ie protein, fats, simple and complex carbohydrates and how these affect insulin response. Focus on limiting simple carbohydrates and increasing high protein foods for ongoing wt loss efforts and glucose management. Wendy Wagner is not getting enough protein or calories on a daily basis. She is often skipping dinner because she is not hungry. Although she skips dinner, she consistently is able to have ice cream in the evening. Wendy Wagner is also skipping fruit, cottage cheese, yogurt, and bread from her lunch meal.   Wendy Wagner is on the following meal plan: Category 3 Her meal plan was individualized for maximum benefit. The following modifications were made to her meal plan: Moved bread from lunch to a snack between breakfast and lunch. Was given bread substitutes, like crackers. Moved 4 oz meat from dinner to lunch for a total of 8 oz at lunch. Instead of skipping dinner, will try to eat 2 oz of meat at a time to fit in protein before eating ice cream snack. Also discussed at length the following behavioral modifications to help maximize Wendy Wagner's success: increasing lean protein intake, decreasing simple carbohydrates, increasing vegetables, increase water intake, avoiding skipping meals, meal planning and cooking strategies, better snacking options, avoiding temptations, and planning for success.   Wendy Wagner has been instructed to work up to a goal of 150 minutes of combined cardio and strengthening exercise per week for weight loss and overall health benefits. Written information was provided and the following handouts were given: category 3 meal plan with modifications made. Wendy Wagner agrees to the  above and will follow up with our clinic in 2 weeks.   OBESITY BEHAVIORAL INTERVENTION VISIT  Today's visit was # 16   Starting weight: 340 Starting date: 01/28/2018 Today's weight : Weight: (!) 324 lb (147 kg)  Today's date: 12/08/18 Total lbs lost to date: 22  ASK: We discussed the diagnosis of obesity with Wendy Wagner today and Wendy Wagner agreed to give [place] permission to discuss obesity behavioral modification therapy today.  ASSESS: Wendy Wagner has the diagnosis of obesity and her BMI today is 50.73. Wendy Wagner is in the action stage of change   ADVISE: Wendy Wagner was educated on the multiple health risks of obesity as well as the benefit of weight loss to improve her health. She was advised of the need for long term treatment and the importance of lifestyle modifications to improve her current health and to decrease her risk of future health problems.  AGREE: Multiple dietary modification options and treatment options were discussed and  Wendy Wagner agreed to follow the recommendations documented in the above note.  ARRANGE: Wendy Wagner was educated on the importance of frequent visits to treat obesity as outlined per CMS and USPSTF guidelines and agreed to schedule her next follow up appointment today.

## 2018-12-11 ENCOUNTER — Ambulatory Visit (INDEPENDENT_AMBULATORY_CARE_PROVIDER_SITE_OTHER)
Admission: RE | Admit: 2018-12-11 | Discharge: 2018-12-11 | Disposition: A | Discharge status: Home / Self Care | Payer: BLUE CROSS/BLUE SHIELD | Source: Ambulatory Visit | Attending: Internal Medicine | Admitting: Internal Medicine

## 2018-12-11 ENCOUNTER — Encounter: Payer: Self-pay | Admitting: Internal Medicine

## 2018-12-11 ENCOUNTER — Ambulatory Visit (INDEPENDENT_AMBULATORY_CARE_PROVIDER_SITE_OTHER): Payer: BLUE CROSS/BLUE SHIELD | Admitting: Internal Medicine

## 2018-12-11 VITALS — BP 124/84 | HR 81 | Temp 98.1°F | Ht 66.5 in | Wt 328.0 lb

## 2018-12-11 DIAGNOSIS — R7303 Prediabetes: Secondary | ICD-10-CM | POA: Diagnosis not present

## 2018-12-11 DIAGNOSIS — G8929 Other chronic pain: Secondary | ICD-10-CM | POA: Insufficient documentation

## 2018-12-11 DIAGNOSIS — M25511 Pain in right shoulder: Secondary | ICD-10-CM

## 2018-12-11 DIAGNOSIS — M25512 Pain in left shoulder: Secondary | ICD-10-CM | POA: Diagnosis not present

## 2018-12-11 DIAGNOSIS — Z0001 Encounter for general adult medical examination with abnormal findings: Secondary | ICD-10-CM | POA: Diagnosis not present

## 2018-12-11 DIAGNOSIS — Z1239 Encounter for other screening for malignant neoplasm of breast: Secondary | ICD-10-CM

## 2018-12-11 NOTE — Patient Instructions (Signed)
Health Maintenance for Postmenopausal Women Menopause is a normal process in which your reproductive ability comes to an end. This process happens gradually over a span of months to years, usually between the ages of 62 and 89. Menopause is complete when you have missed 12 consecutive menstrual periods. It is important to talk with your health care provider about some of the most common conditions that affect postmenopausal women, such as heart disease, cancer, and bone loss (osteoporosis). Adopting a healthy lifestyle and getting preventive care can help to promote your health and wellness. Those actions can also lower your chances of developing some of these common conditions. What should I know about menopause? During menopause, you may experience a number of symptoms, such as:  Moderate-to-severe hot flashes.  Night sweats.  Decrease in sex drive.  Mood swings.  Headaches.  Tiredness.  Irritability.  Memory problems.  Insomnia. Choosing to treat or not to treat menopausal changes is an individual decision that you make with your health care provider. What should I know about hormone replacement therapy and supplements? Hormone therapy products are effective for treating symptoms that are associated with menopause, such as hot flashes and night sweats. Hormone replacement carries certain risks, especially as you become older. If you are thinking about using estrogen or estrogen with progestin treatments, discuss the benefits and risks with your health care provider. What should I know about heart disease and stroke? Heart disease, heart attack, and stroke become more likely as you age. This may be due, in part, to the hormonal changes that your body experiences during menopause. These can affect how your body processes dietary fats, triglycerides, and cholesterol. Heart attack and stroke are both medical emergencies. There are many things that you can do to help prevent heart disease  and stroke:  Have your blood pressure checked at least every 1-2 years. High blood pressure causes heart disease and increases the risk of stroke.  If you are 79-72 years old, ask your health care provider if you should take aspirin to prevent a heart attack or a stroke.  Do not use any tobacco products, including cigarettes, chewing tobacco, or electronic cigarettes. If you need help quitting, ask your health care provider.  It is important to eat a healthy diet and maintain a healthy weight. ? Be sure to include plenty of vegetables, fruits, low-fat dairy products, and lean protein. ? Avoid eating foods that are high in solid fats, added sugars, or salt (sodium).  Get regular exercise. This is one of the most important things that you can do for your health. ? Try to exercise for at least 150 minutes each week. The type of exercise that you do should increase your heart rate and make you sweat. This is known as moderate-intensity exercise. ? Try to do strengthening exercises at least twice each week. Do these in addition to the moderate-intensity exercise.  Know your numbers.Ask your health care provider to check your cholesterol and your blood glucose. Continue to have your blood tested as directed by your health care provider.  What should I know about cancer screening? There are several types of cancer. Take the following steps to reduce your risk and to catch any cancer development as early as possible. Breast Cancer  Practice breast self-awareness. ? This means understanding how your breasts normally appear and feel. ? It also means doing regular breast self-exams. Let your health care provider know about any changes, no matter how small.  If you are 40 or  older, have a clinician do a breast exam (clinical breast exam or CBE) every year. Depending on your age, family history, and medical history, it may be recommended that you also have a yearly breast X-ray (mammogram).  If you  have a family history of breast cancer, talk with your health care provider about genetic screening.  If you are at high risk for breast cancer, talk with your health care provider about having an MRI and a mammogram every year.  Breast cancer (BRCA) gene test is recommended for women who have family members with BRCA-related cancers. Results of the assessment will determine the need for genetic counseling and BRCA1 and for BRCA2 testing. BRCA-related cancers include these types: ? Breast. This occurs in males or females. ? Ovarian. ? Tubal. This may also be called fallopian tube cancer. ? Cancer of the abdominal or pelvic lining (peritoneal cancer). ? Prostate. ? Pancreatic. Cervical, Uterine, and Ovarian Cancer Your health care provider may recommend that you be screened regularly for cancer of the pelvic organs. These include your ovaries, uterus, and vagina. This screening involves a pelvic exam, which includes checking for microscopic changes to the surface of your cervix (Pap test).  For women ages 21-65, health care providers may recommend a pelvic exam and a Pap test every three years. For women ages 39-65, they may recommend the Pap test and pelvic exam, combined with testing for human papilloma virus (HPV), every five years. Some types of HPV increase your risk of cervical cancer. Testing for HPV may also be done on women of any age who have unclear Pap test results.  Other health care providers may not recommend any screening for nonpregnant women who are considered low risk for pelvic cancer and have no symptoms. Ask your health care provider if a screening pelvic exam is right for you.  If you have had past treatment for cervical cancer or a condition that could lead to cancer, you need Pap tests and screening for cancer for at least 20 years after your treatment. If Pap tests have been discontinued for you, your risk factors (such as having a new sexual partner) need to be reassessed  to determine if you should start having screenings again. Some women have medical problems that increase the chance of getting cervical cancer. In these cases, your health care provider may recommend that you have screening and Pap tests more often.  If you have a family history of uterine cancer or ovarian cancer, talk with your health care provider about genetic screening.  If you have vaginal bleeding after reaching menopause, tell your health care provider.  There are currently no reliable tests available to screen for ovarian cancer. Lung Cancer Lung cancer screening is recommended for adults 57-50 years old who are at high risk for lung cancer because of a history of smoking. A yearly low-dose CT scan of the lungs is recommended if you:  Currently smoke.  Have a history of at least 30 pack-years of smoking and you currently smoke or have quit within the past 15 years. A pack-year is smoking an average of one pack of cigarettes per day for one year. Yearly screening should:  Continue until it has been 15 years since you quit.  Stop if you develop a health problem that would prevent you from having lung cancer treatment. Colorectal Cancer  This type of cancer can be detected and can often be prevented.  Routine colorectal cancer screening usually begins at age 12 and continues through  age 75.  If you have risk factors for colon cancer, your health care provider may recommend that you be screened at an earlier age.  If you have a family history of colorectal cancer, talk with your health care provider about genetic screening.  Your health care provider may also recommend using home test kits to check for hidden blood in your stool.  A small camera at the end of a tube can be used to examine your colon directly (sigmoidoscopy or colonoscopy). This is done to check for the earliest forms of colorectal cancer.  Direct examination of the colon should be repeated every 5-10 years until  age 75. However, if early forms of precancerous polyps or small growths are found or if you have a family history or genetic risk for colorectal cancer, you may need to be screened more often. Skin Cancer  Check your skin from head to toe regularly.  Monitor any moles. Be sure to tell your health care provider: ? About any new moles or changes in moles, especially if there is a change in a mole's shape or color. ? If you have a mole that is larger than the size of a pencil eraser.  If any of your family members has a history of skin cancer, especially at a young age, talk with your health care provider about genetic screening.  Always use sunscreen. Apply sunscreen liberally and repeatedly throughout the day.  Whenever you are outside, protect yourself by wearing long sleeves, pants, a wide-brimmed hat, and sunglasses. What should I know about osteoporosis? Osteoporosis is a condition in which bone destruction happens more quickly than new bone creation. After menopause, you may be at an increased risk for osteoporosis. To help prevent osteoporosis or the bone fractures that can happen because of osteoporosis, the following is recommended:  If you are 19-50 years old, get at least 1,000 mg of calcium and at least 600 mg of vitamin D per day.  If you are older than age 50 but younger than age 70, get at least 1,200 mg of calcium and at least 600 mg of vitamin D per day.  If you are older than age 70, get at least 1,200 mg of calcium and at least 800 mg of vitamin D per day. Smoking and excessive alcohol intake increase the risk of osteoporosis. Eat foods that are rich in calcium and vitamin D, and do weight-bearing exercises several times each week as directed by your health care provider. What should I know about how menopause affects my mental health? Depression may occur at any age, but it is more common as you become older. Common symptoms of depression include:  Low or sad  mood.  Changes in sleep patterns.  Changes in appetite or eating patterns.  Feeling an overall lack of motivation or enjoyment of activities that you previously enjoyed.  Frequent crying spells. Talk with your health care provider if you think that you are experiencing depression. What should I know about immunizations? It is important that you get and maintain your immunizations. These include:  Tetanus, diphtheria, and pertussis (Tdap) booster vaccine.  Influenza every year before the flu season begins.  Pneumonia vaccine.  Shingles vaccine. Your health care provider may also recommend other immunizations. This information is not intended to replace advice given to you by your health care provider. Make sure you discuss any questions you have with your health care provider. Document Released: 11/15/2005 Document Revised: 04/12/2016 Document Reviewed: 06/27/2015 Elsevier Interactive Patient Education    2019 [place].

## 2018-12-11 NOTE — Assessment & Plan Note (Signed)
Continue Metformin A1C being followed by [facility] Encouraged her to consume a low carb diet and exercise for weight loss

## 2018-12-11 NOTE — Assessment & Plan Note (Signed)
Xray bilateral shoulders today She will make an appt with Dr. Alfonso Ramus Continue Meloxicam and Tramadol as prescribed

## 2018-12-11 NOTE — Progress Notes (Signed)
Subjective:    Patient ID: Wendy Wagner, female    DOB: May 11, 1966, 53 y.o.   MRN: [id number]  HPI  Pt presents to the clinic today for her annual exam. She is also due to follow up chronic conditions.  OA: Mainly in her shoulders. She takes Meloxicam and Tramadol with some relief.  Prediabetes: Follows with [facility]. She is taking Metformin as prescribed. She has not noticed any weight loss with this medication.  Flu: never Tetanus: < 10 years ago Pap Smear: 05/2017 Mammogram: > 5 years Colon Screening: never Vision Screening: annually Dentist: as needed  Diet: She does eat meat. She consumes more veggies than fruits. She does not eat fried foods. She drinks mostly water. Exercise: Walking   Review of Systems      Past Medical History:  Diagnosis Date  . Anemia   . Back pain   . Childhood asthma   . GERD (gastroesophageal reflux disease)   . Heart murmur   . Leg edema     Current Outpatient Medications  Medication Sig Dispense Refill  . meloxicam (MOBIC) 15 MG tablet Take 1 tablet (15 mg total) by mouth daily. 30 tablet 5  . metFORMIN (GLUCOPHAGE) 500 MG tablet Take 1 tablet (500 mg total) by mouth daily with breakfast. 30 tablet 0  . traMADol (ULTRAM) 50 MG tablet Take 50 mg by mouth every 6 (six) hours as needed.   0  . triamcinolone cream (KENALOG) 0.1 % Apply 1 application topically 2 (two) times daily. 30 g 0  . Vitamin D, Ergocalciferol, (DRISDOL) 1.25 MG (50000 UT) CAPS capsule Take 1 capsule (50,000 Units total) by mouth every 7 (seven) days. 4 capsule 0   No current facility-administered medications for this visit.     Allergies  Allergen Reactions  . No Known Drug Allergy     Family History  Problem Relation Age of Onset  . Cancer Mother        brain  . Cancer Father        brain, lung  . Alcoholism Father   . Diabetes Neg Hx   . Heart disease Neg Hx   . Stroke Neg Hx     Social History   Socioeconomic History  .  Marital status: Single    Spouse name: Not on file  . Number of children: Not on file  . Years of education: Not on file  . Highest education level: Not on file  Occupational History  . Occupation: [employer]  . Financial resource strain: Not on file  . Food insecurity:    Worry: Not on file    Inability: Not on file  . Transportation needs:    Medical: Not on file    Non-medical: Not on file  Tobacco Use  . Smoking status: Former Smoker    Packs/day: 1.00    Years: 8.00    Pack years: 8.00    Types: Cigarettes  . Smokeless tobacco: Never Used  Substance and Sexual Activity  . Alcohol use: No    Alcohol/week: 0.0 standard drinks  . Drug use: No  . Sexual activity: Not Currently  Lifestyle  . Physical activity:    Days per week: Not on file    Minutes per session: Not on file  . Stress: Not on file  Relationships  . Social connections:    Talks on phone: Not on file    Gets together: Not on file    Attends religious  service: Not on file    Active member of club or organization: Not on file    Attends meetings of clubs or organizations: Not on file    Relationship status: Not on file  . Intimate partner violence:    Fear of current or ex partner: Not on file    Emotionally abused: Not on file    Physically abused: Not on file    Forced sexual activity: Not on file  Other Topics Concern  . Not on file  Social History Narrative  . Not on file     Constitutional: Denies fever, malaise, fatigue, headache or abrupt weight changes.  HEENT: Denies eye pain, eye redness, ear pain, ringing in the ears, wax buildup, runny nose, nasal congestion, bloody nose, or sore throat. Respiratory: Denies difficulty breathing, shortness of breath, cough or sputum production.   Cardiovascular: Denies chest pain, chest tightness, palpitations or swelling in the hands or feet.  Gastrointestinal: Denies abdominal pain, bloating, constipation, diarrhea or blood in the stool.  GU:  Denies urgency, frequency, pain with urination, burning sensation, blood in urine, odor or discharge. Musculoskeletal: Pt reports bilateral shoulder pain. Denies decrease in range of motion, difficulty with gait, muscle pain or joint swelling.  Skin: Denies redness, rashes, lesions or ulcercations.  Neurological: Denies dizziness, difficulty with memory, difficulty with speech or problems with balance and coordination.  Psych: Denies anxiety, depression, SI/HI.  No other specific complaints in a complete review of systems (except as listed in HPI above).  Objective:   Physical Exam    BP 124/84   Pulse 81   Temp 98.1 F (36.7 C) (Oral)   LMP 05/22/2016   SpO2 98%  Wt Readings from Last 3 Encounters:  12/08/18 (!) 324 lb (147 kg)  11/17/18 (!) 322 lb (146.1 kg)  10/29/18 (!) 326 lb (147.9 kg)    General: Appears her stated age, obese, in NAD. Skin: Warm, dry and intact.  HEENT: Head: normal shape and size; Eyes: sclera white, no icterus, conjunctiva pink, PERRLA and EOMs intact; Ears: Tm's gray and intact, normal light reflex; Throat/Mouth: Teeth present, mucosa pink and moist, no exudate, lesions or ulcerations noted.  Neck:  Neck supple, trachea midline. No masses, lumps or thyromegaly present.  Cardiovascular: Normal rate and rhythm. S1,S2 noted.  No murmur, rubs or gallops noted. No JVD or BLE edema. No carotid bruits noted. Pulmonary/Chest: Normal effort and positive vesicular breath sounds. No respiratory distress. No wheezes, rales or ronchi noted.  Abdomen: Soft and nontender. Normal bowel sounds. No distention or masses noted. Liver, spleen and kidneys non palpable. Musculoskeletal: Strength 5/5 BUE/BLE. No difficulty with gait.  Neurological: Alert and oriented. Cranial nerves II-XII grossly intact. Coordination normal.  Psychiatric: Mood and affect flat. Behavior is normal. Judgment and thought content normal.     BMET    Component Value Date/Time   NA 143  06/04/2018 0938   K 4.5 06/04/2018 0938   CL 104 06/04/2018 0938   CO2 22 06/04/2018 0938   GLUCOSE 95 06/04/2018 0938   GLUCOSE 90 05/30/2017 1510   BUN 11 06/04/2018 0938   CREATININE 0.89 06/04/2018 0938   CALCIUM 9.1 06/04/2018 0938   GFRNONAA 74 06/04/2018 0938   GFRAA 85 06/04/2018 0938    Lipid Panel     Component Value Date/Time   CHOL 174 06/04/2018 0938   TRIG 91 06/04/2018 0938   HDL 67 06/04/2018 0938   CHOLHDL 3 05/30/2017 1510   VLDL 24.8 05/30/2017 1510  LDLCALC 89 06/04/2018 0938    CBC    Component Value Date/Time   WBC 4.3 01/28/2018 1147   WBC 4.8 05/30/2017 1510   RBC 4.57 01/28/2018 1147   RBC 4.40 05/30/2017 1510   HGB 12.2 01/28/2018 1147   HCT 39.4 01/28/2018 1147   PLT 310.0 05/30/2017 1510   MCV 86 01/28/2018 1147   MCH 26.7 01/28/2018 1147   MCHC 31.0 (L) 01/28/2018 1147   MCHC 31.8 05/30/2017 1510   RDW 14.9 01/28/2018 1147   LYMPHSABS 2.0 01/28/2018 1147   EOSABS 0.2 01/28/2018 1147   BASOSABS 0.1 01/28/2018 1147    Hgb A1C Lab Results  Component Value Date   HGBA1C 5.8 (H) 06/04/2018          Assessment & Plan:   Preventative Health Maintenance:  She declines flu shot today She declines tetanus today Pap smear UTD Mammogram ordered, she will call [facility] to schedule, number provided She declines colonoscopy but is agreeable to Cologuard- will call insurance company Encouraged her to consume a balanced diet and exercise regimen (she will continue to follow with [facility]) Advised her to see an eye doctor and dentist annually Labs from August reviewed  RTC in 1 year, sooner if needed Webb Silversmith, NP

## 2018-12-15 NOTE — Progress Notes (Unsigned)
  Office: [phone]  /  Fax: [phone]    Date:***   Time Seen:*** Duration:*** Provider: Glennie Isle, Psy.D. Type of Session: Individual Therapy  Type of Contact: Face-to-face  Session Content: Wendy Wagner is a 53 y.o. female presenting for a follow-up appointment to address the previously established treatment goal of decreasing emotional eating. The session was initiated with the administration of the PHQ-9 and GAD-7, as well as a brief check-in. *** Wendy Wagner was receptive to today's session as evidenced by openness to sharing, responsiveness to feedback, and ***.  Mental Status Examination: Wendy Wagner arrived on time for the appointment. She presented as appropriately dressed and groomed. Wendy Wagner appeared her stated age and demonstrated adequate orientation to time, place, person, and purpose of the appointment. She also demonstrated appropriate eye contact. No psychomotor abnormalities or behavioral peculiarities noted. Her mood was {gbmood:21757} with congruent affect. Her thought processes were logical, linear, and goal-directed. No hallucinations, delusions, bizarre thinking or behavior reported or observed. Judgment, insight, and impulse control appeared to be grossly intact. There was no evidence of paraphasias (i.e., errors in speech, gross mispronunciations, and word substitutions), repetition deficits, or disturbances in volume or prosody (i.e., rhythm and intonation). There was no evidence of attention or memory impairments. Wendy Wagner denied current suicidal and homicidal ideation, plan and intent.   Structured Assessment Results: The Patient Health Questionnaire-9 (PHQ-9) is a self-report measure that assesses symptoms and severity of depression over the course of the last two weeks. Wendy Wagner obtained a score of *** suggesting {GBPHQ9SEVERITY:21752}. Wendy Wagner finds the endorsed symptoms to be {gbphq9difficulty:21754}.  The Generalized Anxiety Disorder-7 (GAD-7) is a brief self-report  measure that assesses symptoms of anxiety over the course of the last two weeks. Wendy Wagner obtained a score of *** suggesting {gbgad7severity:21753}.  Interventions:  {Interventions:22172}  DSM-5 Diagnosis: 311 (F32.8) Other Specified Depressive Disorder, Emotional Eating Behaviors  Treatment Goal & Progress: During the initial appointment with this provider, the following treatment goal was established: decrease emotional eating. Wendy Wagner has demonstrated progress in her goal as evidenced by ***  Plan: Wendy Wagner continues to appear able and willing to participate as evidenced by engagement in reciprocal conversation, and asking questions for clarification as appropriate.*** The next appointment will be scheduled in {gbweeks:21758}. The next session will focus on reviewing learned skills, and working towards the established treatment goal.***

## 2018-12-16 ENCOUNTER — Ambulatory Visit (INDEPENDENT_AMBULATORY_CARE_PROVIDER_SITE_OTHER): Payer: BLUE CROSS/BLUE SHIELD | Admitting: Physician Assistant

## 2018-12-16 ENCOUNTER — Encounter (INDEPENDENT_AMBULATORY_CARE_PROVIDER_SITE_OTHER): Payer: Self-pay | Admitting: Physician Assistant

## 2018-12-16 ENCOUNTER — Other Ambulatory Visit: Payer: Self-pay

## 2018-12-16 VITALS — BP 128/84 | HR 70 | Temp 98.5°F | Ht 67.0 in | Wt 325.0 lb

## 2018-12-16 DIAGNOSIS — R7303 Prediabetes: Secondary | ICD-10-CM

## 2018-12-16 DIAGNOSIS — Z9189 Other specified personal risk factors, not elsewhere classified: Secondary | ICD-10-CM

## 2018-12-16 DIAGNOSIS — E66813 Obesity, class 3: Secondary | ICD-10-CM

## 2018-12-16 DIAGNOSIS — E559 Vitamin D deficiency, unspecified: Secondary | ICD-10-CM | POA: Diagnosis not present

## 2018-12-16 DIAGNOSIS — Z6841 Body Mass Index (BMI) 40.0 and over, adult: Secondary | ICD-10-CM

## 2018-12-16 MED ORDER — VITAMIN D (ERGOCALCIFEROL) 1.25 MG (50000 UNIT) PO CAPS: 50000.0000 [IU] | ORAL_CAPSULE | ORAL | 0 refills | Status: DC

## 2018-12-16 NOTE — Progress Notes (Signed)
Office: [phone]  /  Fax: [phone]   HPI:   Chief Complaint: OBESITY Wendy Wagner is here to discuss her progress with her obesity treatment plan. She is on the Category 3 plan and is following her eating plan approximately 60% of the time. She states she is exercising 0 minutes 0 times per week. [name] reports that she has been stressed and is not eating after lunch. She is ready to get back on track.  Her weight is (!) 325 lb (147.4 kg) today and has had a weight gain of 3 lbs since her last visit. She has lost 15 lbs since starting treatment with [place].  Vitamin D deficiency [name] has a diagnosis of Vitamin D deficiency. She is currently taking prescription Vit D and denies nausea, vomiting or muscle weakness.  At risk for osteopenia and osteoporosis [name] is at higher risk of osteopenia and osteoporosis due to Vitamin D deficiency.   Pre-Diabetes [name] has a diagnosis of prediabetes based on her elevated Hgb A1c and was informed this puts her at greater risk of developing diabetes. She is taking metformin currently and continues to work on diet and exercise to decrease risk of diabetes. She denies nausea, vomiting, diarrhea, or polyphagia.   ASSESSMENT AND PLAN:  Vitamin D deficiency - Plan: VITAMIN D 25 Hydroxy (Vit-D Deficiency, Fractures), Vitamin D, Ergocalciferol, (DRISDOL) 1.25 MG (50000 UT) CAPS capsule  Prediabetes - Plan: Comprehensive metabolic panel, Hemoglobin A1c, Insulin, random  At risk for osteoporosis  Class 3 severe obesity with serious comorbidity and body mass index (BMI) of 50.0 to 59.9 in adult, unspecified obesity type ([place])  PLAN:  Vitamin D Deficiency Wendy Wagner was informed that low Vitamin D levels contributes to fatigue and are associated with obesity, breast, and colon cancer. She agrees to continue to take prescription Vit D @ 50,000 IU every week #4 with 0 refills and will have routine testing of Vitamin D today. She was informed of the risk  of over-replacement of Vitamin D and agrees to not increase her dose unless she discusses this with [place] first. Wendy Wagner agrees to follow-up with our clinic in 3 weeks.  At risk for osteopenia and osteoporosis Wendy Wagner was given extended  (15 minutes) osteoporosis prevention counseling today. Wendy Wagner is at risk for osteopenia and osteoporsis due to her Vitamin D deficiency. She was encouraged to take her Vitamin D and follow her higher calcium diet and increase strengthening exercise to help strengthen her bones and decrease her risk of osteopenia and osteoporosis.  Pre-Diabetes Wendy Wagner will continue to work on weight loss, exercise, and decreasing simple carbohydrates in her diet to help decrease the risk of diabetes. We dicussed metformin including benefits and risks. She was informed that eating too many simple carbohydrates or too many calories at one sitting increases the likelihood of GI side effects. Wendy Wagner is on metformin  and a prescription was not written today. Kollins agreed to follow-up with [place] as directed to monitor her progress.  Obesity Wendy Wagner is currently in the action stage of change. As such, her goal is to continue with weight loss efforts. She has agreed to keep a food journal with 1500 calories and 90 grams of protein daily. Wendy Wagner has been instructed to work up to a goal of 150 minutes of combined cardio and strengthening exercise per week for weight loss and overall health benefits. We discussed the following Behavioral Modification Strategies today: increasing lean protein intake and no skipping meals.  Wendy Wagner has agreed to follow-up with our clinic  in 3 weeks. She was informed of the importance of frequent follow-up visits to maximize her success with intensive lifestyle modifications for her multiple health conditions.  ALLERGIES: Allergies  Allergen Reactions   No Known Drug Allergy     MEDICATIONS: Current Outpatient Medications on File Prior to Visit    Medication Sig Dispense Refill   meloxicam (MOBIC) 15 MG tablet Take 1 tablet (15 mg total) by mouth daily. 30 tablet 5   metFORMIN (GLUCOPHAGE) 500 MG tablet Take 1 tablet (500 mg total) by mouth daily with breakfast. 30 tablet 0   traMADol (ULTRAM) 50 MG tablet Take 50 mg by mouth every 6 (six) hours as needed.   0   triamcinolone cream (KENALOG) 0.1 % Apply 1 application topically 2 (two) times daily. 30 g 0   No current facility-administered medications on file prior to visit.     PAST MEDICAL HISTORY: Past Medical History:  Diagnosis Date   Anemia    Back pain    Childhood asthma    GERD (gastroesophageal reflux disease)    Heart murmur    Leg edema     PAST SURGICAL HISTORY: Past Surgical History:  Procedure Laterality Date   CESAREAN SECTION     WISDOM TOOTH EXTRACTION      SOCIAL HISTORY: Social History   Tobacco Use   Smoking status: Former Smoker    Packs/day: 1.00    Years: 8.00    Pack years: 8.00    Types: Cigarettes   Smokeless tobacco: Never Used  Substance Use Topics   Alcohol use: No    Alcohol/week: 0.0 standard drinks   Drug use: No    FAMILY HISTORY: Family History  Problem Relation Age of Onset   Cancer Mother        brain   Cancer Father        brain, lung   Alcoholism Father    Diabetes Neg Hx    Heart disease Neg Hx    Stroke Neg Hx    ROS: Review of Systems  Constitutional: Negative for weight loss.  Gastrointestinal: Negative for diarrhea, nausea and vomiting.  Musculoskeletal:       Negative for muscle weakness.  Endo/Heme/Allergies:       Negative for polyphagia. Negative for hypoglycemia.   PHYSICAL EXAM: Blood pressure 128/84, pulse 70, temperature 98.5 F (36.9 C), height 5\' 7"  (1.702 m), weight (!) 325 lb (147.4 kg), last menstrual period 05/22/2016, SpO2 98 %. Body mass index is 50.9 kg/m. Physical Exam Vitals signs reviewed.  Constitutional:      Appearance: Normal appearance. She is  obese.  Cardiovascular:     Rate and Rhythm: Normal rate.     Pulses: Normal pulses.  Pulmonary:     Effort: Pulmonary effort is normal.     Breath sounds: Normal breath sounds.  Musculoskeletal: Normal range of motion.  Skin:    General: Skin is warm and dry.  Neurological:     Mental Status: She is alert and oriented to person, place, and time.  Psychiatric:        Behavior: Behavior normal.   RECENT LABS AND TESTS: BMET    Component Value Date/Time   NA 143 06/04/2018 0938   K 4.5 06/04/2018 0938   CL 104 06/04/2018 0938   CO2 22 06/04/2018 0938   GLUCOSE 95 06/04/2018 0938   GLUCOSE 90 05/30/2017 1510   BUN 11 06/04/2018 0938   CREATININE 0.89 06/04/2018 0938   CALCIUM 9.1 06/04/2018 1610  GFRNONAA 74 06/04/2018 0938   GFRAA 85 06/04/2018 0938   Lab Results  Component Value Date   HGBA1C 5.8 (H) 06/04/2018   HGBA1C 5.9 (H) 01/28/2018   HGBA1C 6.2 05/30/2017   HGBA1C 6.2 05/29/2016   HGBA1C 5.8 02/23/2015   Lab Results  Component Value Date   INSULIN 21.9 06/04/2018   INSULIN 20.4 01/28/2018   CBC    Component Value Date/Time   WBC 4.3 01/28/2018 1147   WBC 4.8 05/30/2017 1510   RBC 4.57 01/28/2018 1147   RBC 4.40 05/30/2017 1510   HGB 12.2 01/28/2018 1147   HCT 39.4 01/28/2018 1147   PLT 310.0 05/30/2017 1510   MCV 86 01/28/2018 1147   MCH 26.7 01/28/2018 1147   MCHC 31.0 (L) 01/28/2018 1147   MCHC 31.8 05/30/2017 1510   RDW 14.9 01/28/2018 1147   LYMPHSABS 2.0 01/28/2018 1147   EOSABS 0.2 01/28/2018 1147   BASOSABS 0.1 01/28/2018 1147   Iron/TIBC/Ferritin/ %Sat No results found for: IRON, TIBC, FERRITIN, IRONPCTSAT Lipid Panel     Component Value Date/Time   CHOL 174 06/04/2018 0938   TRIG 91 06/04/2018 0938   HDL 67 06/04/2018 0938   CHOLHDL 3 05/30/2017 1510   VLDL 24.8 05/30/2017 1510   LDLCALC 89 06/04/2018 0938   Hepatic Function Panel     Component Value Date/Time   PROT 6.8 06/04/2018 0938   ALBUMIN 4.0 06/04/2018 0938   AST  11 06/04/2018 0938   ALT 12 06/04/2018 0938   ALKPHOS 77 06/04/2018 0938   BILITOT 0.3 06/04/2018 0938      Component Value Date/Time   TSH 0.888 01/28/2018 1147   TSH 0.79 02/23/2015 1512   Results for SHAELYN, DECARLI (MRN [id number]) as of 12/16/2018 10:02  Ref. Range 06/04/2018 09:38  Vitamin D, 25-Hydroxy Latest Ref Range: 30.0 - 100.0 ng/mL 26.3 (L)   OBESITY BEHAVIORAL INTERVENTION VISIT  Today's visit was #16  Starting weight: 340 lbs Starting date: 01/28/2018 Today's weight: 325 lbs  Today's date: 12/16/2018 Total lbs lost to date: 15    12/16/2018  Height 5\' 7"  (1.702 m)  Weight 325 lb (147.4 kg) (A)  BMI (Calculated) 50.89  BLOOD PRESSURE - SYSTOLIC 834  BLOOD PRESSURE - DIASTOLIC 84   Body Fat % 19.6 %   ASK: We discussed the diagnosis of obesity with Albin Fischer today and Kyera agreed to give [place] permission to discuss obesity behavioral modification therapy today.  ASSESS: Alyric has the diagnosis of obesity and her BMI today is 50.89. Othelia is in the action stage of change.   ADVISE: Mykiah was educated on the multiple health risks of obesity as well as the benefit of weight loss to improve her health. She was advised of the need for long term treatment and the importance of lifestyle modifications to improve her current health and to decrease her risk of future health problems.  AGREE: Multiple dietary modification options and treatment options were discussed and  Antoinett agreed to follow the recommendations documented in the above note.  ARRANGE: Brylee was educated on the importance of frequent visits to treat obesity as outlined per CMS and USPSTF guidelines and agreed to schedule her next follow up appointment today.  Migdalia Dk, am acting as transcriptionist for Abby Potash, PA-C I, Abby Potash, PA-C have reviewed above note and agree with its content

## 2018-12-17 LAB — COMPREHENSIVE METABOLIC PANEL
ALT: 14 IU/L (ref 0–32)
AST: 18 IU/L (ref 0–40)
Albumin/Globulin Ratio: 1.5 (ref 1.2–2.2)
Albumin: 4.1 g/dL (ref 3.8–4.9)
Alkaline Phosphatase: 77 IU/L (ref 39–117)
BUN/Creatinine Ratio: 15 (ref 9–23)
BUN: 14 mg/dL (ref 6–24)
Bilirubin Total: 0.3 mg/dL (ref 0.0–1.2)
CO2: 21 mmol/L (ref 20–29)
Calcium: 8.9 mg/dL (ref 8.7–10.2)
Chloride: 104 mmol/L (ref 96–106)
Creatinine, Ser: 0.93 mg/dL (ref 0.57–1.00)
GFR calc Af Amer: 81 mL/min/{1.73_m2} (ref >59)
GFR, EST NON AFRICAN AMERICAN: 70 mL/min/{1.73_m2} (ref >59)
Globulin, Total: 2.8 g/dL (ref 1.5–4.5)
Glucose: 97 mg/dL (ref 65–99)
Potassium: 4.4 mmol/L (ref 3.5–5.2)
Sodium: 143 mmol/L (ref 134–144)
Total Protein: 6.9 g/dL (ref 6.0–8.5)

## 2018-12-17 LAB — HEMOGLOBIN A1C
Est. average glucose Bld gHb Est-mCnc: 120 mg/dL
Hgb A1c MFr Bld: 5.8 % — ABNORMAL HIGH (ref 4.8–5.6)

## 2018-12-17 LAB — INSULIN, RANDOM: INSULIN: 37 u[IU]/mL — ABNORMAL HIGH (ref 2.6–24.9)

## 2018-12-17 LAB — VITAMIN D 25 HYDROXY (VIT D DEFICIENCY, FRACTURES): Vit D, 25-Hydroxy: 37.3 ng/mL (ref 30.0–100.0)

## 2018-12-18 ENCOUNTER — Other Ambulatory Visit: Payer: Self-pay | Admitting: Internal Medicine

## 2018-12-22 ENCOUNTER — Ambulatory Visit (INDEPENDENT_AMBULATORY_CARE_PROVIDER_SITE_OTHER): Payer: Self-pay | Admitting: Psychology

## 2018-12-30 ENCOUNTER — Encounter (INDEPENDENT_AMBULATORY_CARE_PROVIDER_SITE_OTHER): Payer: Self-pay

## 2019-01-05 ENCOUNTER — Ambulatory Visit (INDEPENDENT_AMBULATORY_CARE_PROVIDER_SITE_OTHER): Payer: BLUE CROSS/BLUE SHIELD | Admitting: Physician Assistant

## 2019-01-05 ENCOUNTER — Other Ambulatory Visit: Payer: Self-pay

## 2019-01-05 ENCOUNTER — Encounter (INDEPENDENT_AMBULATORY_CARE_PROVIDER_SITE_OTHER): Payer: Self-pay | Admitting: Physician Assistant

## 2019-01-05 DIAGNOSIS — Z6841 Body Mass Index (BMI) 40.0 and over, adult: Secondary | ICD-10-CM

## 2019-01-05 DIAGNOSIS — E559 Vitamin D deficiency, unspecified: Secondary | ICD-10-CM

## 2019-01-06 MED ORDER — VITAMIN D (ERGOCALCIFEROL) 1.25 MG (50000 UNIT) PO CAPS: 50000.0000 [IU] | ORAL_CAPSULE | ORAL | 0 refills | Status: DC

## 2019-01-06 NOTE — Progress Notes (Signed)
Office: [phone]  /  Fax: [phone] TeleHealth Visit:  Wendy Wagner has consented to this TeleHealth visit today via telephone. The patient is located at home, the provider is located at the [facility] office. The participants in this visit include the listed provider and patient.  HPI:   Chief Complaint: OBESITY Wendy Wagner is here to discuss her progress with her obesity treatment plan. She is journaling 1500 calories + 90 grams of protein daily and is following her eating plan approximately 0% of the time. She states she is walking 10 minutes 7 days a week. Emerita reports that she has had a hard time finding eggs and meat. 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 We were unable to weigh the patient today for this TeleHealth visit.She feels as if she has maintained her weight since her last visit. She has lost 15 lbs since starting treatment with [place].  Vitamin D deficiency Wendy Wagner has a diagnosis of Vitamin D deficiency. She is currently taking prescription Vit D and denies nausea, vomiting or muscle weakness.  ASSESSMENT AND PLAN:  Vitamin D deficiency - Plan: Vitamin D, Ergocalciferol, (DRISDOL) 1.25 MG (50000 UT) CAPS capsule  Class 3 severe obesity with serious comorbidity and body mass index (BMI) of 50.0 to 59.9 in adult, unspecified obesity type ([place])  PLAN:  Vitamin D Deficiency Wendy Wagner was informed that low Vitamin D levels contributes to fatigue and are associated with obesity, breast, and colon cancer. She agrees to continue to take prescription Vit D @ 50,000 IU every week #4 with 0 refills and will follow-up for routine testing of Vitamin D, at least 2-3 times per year. She was informed of the risk of over-replacement of Vitamin D and agrees to not increase her dose unless she discusses this with [place] first. [name] agrees to follow-up with our clinic in 3 weeks.  I spent > than 50% of the 15 minute visit on  counseling as documented in the note.  Obesity Wendy Wagner is currently in the action stage of change. As such, her goal is to continue with weight loss efforts. She has agreed to keep a food journal with 1500 calories and 95 grams of protein daily. Wendy Wagner has been instructed to work up to a goal of 150 minutes of combined cardio and strengthening exercise per week for weight loss and overall health benefits. We discussed the following Behavioral Modification Strategies today: increasing lean protein intake and work on meal planning and easy cooking plans.  Wendy Wagner has agreed to follow-up with our clinic in 3 weeks. She was informed of the importance of frequent follow up visits to maximize her success with intensive lifestyle modifications for her multiple health conditions.  ALLERGIES: Allergies  Allergen Reactions  . No Known Drug Allergy     MEDICATIONS: Current Outpatient Medications on File Prior to Visit  Medication Sig Dispense Refill  . meloxicam (MOBIC) 15 MG tablet Take 1 tablet (15 mg total) by mouth daily. 30 tablet 5  . metFORMIN (GLUCOPHAGE) 500 MG tablet Take 1 tablet (500 mg total) by mouth daily with breakfast. 30 tablet 0  . traMADol (ULTRAM) 50 MG tablet Take 50 mg by mouth every 6 (six) hours as needed.   0  . triamcinolone cream (KENALOG) 0.1 % Apply 1 application topically 2 (two) times daily. 30 g 0  . Vitamin D, Ergocalciferol, (DRISDOL) 1.25 MG (50000 UT) CAPS capsule Take 1 capsule (50,000 Units total) by  mouth every 7 (seven) days. 4 capsule 0   No current facility-administered medications on file prior to visit.     PAST MEDICAL HISTORY: Past Medical History:  Diagnosis Date  . Anemia   . Back pain   . Childhood asthma   . GERD (gastroesophageal reflux disease)   . Heart murmur   . Leg edema     PAST SURGICAL HISTORY: Past Surgical History:  Procedure Laterality Date  . CESAREAN SECTION    . WISDOM TOOTH EXTRACTION      SOCIAL HISTORY: Social  History   Tobacco Use  . Smoking status: Former Smoker    Packs/day: 1.00    Years: 8.00    Pack years: 8.00    Types: Cigarettes  . Smokeless tobacco: Never Used  Substance Use Topics  . Alcohol use: No    Alcohol/week: 0.0 standard drinks  . Drug use: No    FAMILY HISTORY: Family History  Problem Relation Age of Onset  . Cancer Mother        brain  . Cancer Father        brain, lung  . Alcoholism Father   . Diabetes Neg Hx   . Heart disease Neg Hx   . Stroke Neg Hx    ROS: Review of Systems  Gastrointestinal: Negative for nausea and vomiting.  Musculoskeletal:       Negative for muscle weakness.   PHYSICAL EXAM: Pt in no acute distress  RECENT LABS AND TESTS: BMET    Component Value Date/Time   NA 143 12/16/2018 0901   K 4.4 12/16/2018 0901   CL 104 12/16/2018 0901   CO2 21 12/16/2018 0901   GLUCOSE 97 12/16/2018 0901   GLUCOSE 90 05/30/2017 1510   BUN 14 12/16/2018 0901   CREATININE 0.93 12/16/2018 0901   CALCIUM 8.9 12/16/2018 0901   GFRNONAA 70 12/16/2018 0901   GFRAA 81 12/16/2018 0901   Lab Results  Component Value Date   HGBA1C 5.8 (H) 12/16/2018   HGBA1C 5.8 (H) 06/04/2018   HGBA1C 5.9 (H) 01/28/2018   HGBA1C 6.2 05/30/2017   HGBA1C 6.2 05/29/2016   Lab Results  Component Value Date   INSULIN 37.0 (H) 12/16/2018   INSULIN 21.9 06/04/2018   INSULIN 20.4 01/28/2018   CBC    Component Value Date/Time   WBC 4.3 01/28/2018 1147   WBC 4.8 05/30/2017 1510   RBC 4.57 01/28/2018 1147   RBC 4.40 05/30/2017 1510   HGB 12.2 01/28/2018 1147   HCT 39.4 01/28/2018 1147   PLT 310.0 05/30/2017 1510   MCV 86 01/28/2018 1147   MCH 26.7 01/28/2018 1147   MCHC 31.0 (L) 01/28/2018 1147   MCHC 31.8 05/30/2017 1510   RDW 14.9 01/28/2018 1147   LYMPHSABS 2.0 01/28/2018 1147   EOSABS 0.2 01/28/2018 1147   BASOSABS 0.1 01/28/2018 1147   Iron/TIBC/Ferritin/ %Sat No results found for: IRON, TIBC, FERRITIN, IRONPCTSAT Lipid Panel     Component Value  Date/Time   CHOL 174 06/04/2018 0938   TRIG 91 06/04/2018 0938   HDL 67 06/04/2018 0938   CHOLHDL 3 05/30/2017 1510   VLDL 24.8 05/30/2017 1510   LDLCALC 89 06/04/2018 0938   Hepatic Function Panel     Component Value Date/Time   PROT 6.9 12/16/2018 0901   ALBUMIN 4.1 12/16/2018 0901   AST 18 12/16/2018 0901   ALT 14 12/16/2018 0901   ALKPHOS 77 12/16/2018 0901   BILITOT 0.3 12/16/2018 0901  Component Value Date/Time   TSH 0.888 01/28/2018 1147   TSH 0.79 02/23/2015 1512   Results for CARNESHA, MARAVILLA (MRN [id number]) as of 01/06/2019 11:45  Ref. Range 12/16/2018 09:01  Vitamin D, 25-Hydroxy Latest Ref Range: 30.0 - 100.0 ng/mL 37.3    I, Michaelene Song, am acting as Location manager for [employer], PA-C I, Abby Potash, PA-C have reviewed above note and agree with its content

## 2019-01-13 ENCOUNTER — Telehealth: Payer: Self-pay | Admitting: Internal Medicine

## 2019-01-13 NOTE — Telephone Encounter (Signed)
Copied from [place] [id number]. Topic: General - Other >> Jan 13, 2019  4:49 PM Keene Breath wrote: Reason for CRM: Patient called to request an appointment with Dr. Garnette Gunner.  Please call patient back at [phone]

## 2019-01-14 ENCOUNTER — Ambulatory Visit (INDEPENDENT_AMBULATORY_CARE_PROVIDER_SITE_OTHER): Payer: BLUE CROSS/BLUE SHIELD | Admitting: Internal Medicine

## 2019-01-14 ENCOUNTER — Other Ambulatory Visit: Payer: Self-pay

## 2019-01-14 ENCOUNTER — Encounter: Payer: Self-pay | Admitting: Internal Medicine

## 2019-01-14 ENCOUNTER — Telehealth: Payer: Self-pay

## 2019-01-14 VITALS — BP 124/84 | HR 77 | Temp 98.4°F | Wt 330.0 lb

## 2019-01-14 DIAGNOSIS — G44311 Acute post-traumatic headache, intractable: Secondary | ICD-10-CM

## 2019-01-14 DIAGNOSIS — M545 Low back pain, unspecified: Secondary | ICD-10-CM

## 2019-01-14 DIAGNOSIS — M25561 Pain in right knee: Secondary | ICD-10-CM

## 2019-01-14 DIAGNOSIS — M25511 Pain in right shoulder: Secondary | ICD-10-CM

## 2019-01-14 DIAGNOSIS — M25562 Pain in left knee: Secondary | ICD-10-CM

## 2019-01-14 DIAGNOSIS — M542 Cervicalgia: Secondary | ICD-10-CM | POA: Diagnosis not present

## 2019-01-14 DIAGNOSIS — M25512 Pain in left shoulder: Secondary | ICD-10-CM

## 2019-01-14 MED ORDER — ORPHENADRINE CITRATE ER 100 MG PO TB12: 100.0000 mg | ORAL_TABLET | Freq: Two times a day (BID) | ORAL | 0 refills | Status: DC

## 2019-01-14 MED ORDER — IBUPROFEN 800 MG PO TABS: 800.0000 mg | ORAL_TABLET | Freq: Three times a day (TID) | ORAL | 2 refills | Status: DC | PRN

## 2019-01-14 MED ORDER — TRIAMCINOLONE ACETONIDE 0.1 % EX CREA: 1.0000 "application " | TOPICAL_CREAM | Freq: Two times a day (BID) | CUTANEOUS | 0 refills | Status: DC
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
## 2019-01-14 NOTE — Telephone Encounter (Signed)
Appt made 4/9 with [facility]

## 2019-01-14 NOTE — Telephone Encounter (Signed)
Left v/m per DPR for pt to cb for appt.

## 2019-01-14 NOTE — Addendum Note (Signed)
Addended by: Jearld Fenton on: 01/14/2019 02:41 PM   Modules accepted: Orders
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
## 2019-01-14 NOTE — Patient Instructions (Signed)

## 2019-01-21 ENCOUNTER — Telehealth: Payer: Self-pay | Admitting: Internal Medicine

## 2019-01-21 DIAGNOSIS — M25511 Pain in right shoulder: Principal | ICD-10-CM

## 2019-01-21 DIAGNOSIS — G8929 Other chronic pain: Secondary | ICD-10-CM

## 2019-01-21 DIAGNOSIS — M25512 Pain in left shoulder: Principal | ICD-10-CM

## 2019-01-21 NOTE — Telephone Encounter (Signed)
Patient called to speak to [facility]. Patient said she went for her appointment with Dr.Kramer at [facility].  Patient's insurance changed the beginning of the year and Dr.Kramer is out of network.  Patient needs to be referred to another Orthopaedic.  Patient was told Dr.Lucie may be the only Orthopaedic in network.  Patient wants to know if Rollene Fare will refill her Tramadol 50 mg.  Patient usually gets the rx from [facility]. Patient uses [facility].

## 2019-01-22 NOTE — Telephone Encounter (Signed)
Referral to ortho placed. Will refill Tramadol in the meantime when she needs it. Does she need a refill now?

## 2019-01-22 NOTE — Telephone Encounter (Signed)
Pt called to check on the status. She is not ready for the refill right now, Dr Alfonso Ramus filled for now.  She is requesting a work note for 4/20 & 4/21. She went to work this week- did not use the previous note and regrets it. She had trouble working and is still having pain that is ongoing and changing. 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

## 2019-01-22 NOTE — Addendum Note (Signed)
Addended by: Jearld Fenton on: 01/22/2019 11:55 AM   Modules accepted: Orders

## 2019-01-22 NOTE — Telephone Encounter (Signed)
If she was able to go to work this week she should be able to go to work next week with rest over the weekend.

## 2019-01-25 ENCOUNTER — Encounter: Payer: Self-pay | Admitting: Internal Medicine

## 2019-01-25 ENCOUNTER — Ambulatory Visit (INDEPENDENT_AMBULATORY_CARE_PROVIDER_SITE_OTHER): Payer: BLUE CROSS/BLUE SHIELD | Admitting: Internal Medicine

## 2019-01-25 ENCOUNTER — Other Ambulatory Visit: Payer: Self-pay

## 2019-01-25 DIAGNOSIS — M545 Low back pain, unspecified: Secondary | ICD-10-CM

## 2019-01-25 DIAGNOSIS — M25511 Pain in right shoulder: Secondary | ICD-10-CM

## 2019-01-25 DIAGNOSIS — M542 Cervicalgia: Secondary | ICD-10-CM | POA: Diagnosis not present

## 2019-01-25 DIAGNOSIS — G44311 Acute post-traumatic headache, intractable: Secondary | ICD-10-CM

## 2019-01-25 DIAGNOSIS — M25512 Pain in left shoulder: Secondary | ICD-10-CM

## 2019-01-25 DIAGNOSIS — G8929 Other chronic pain: Secondary | ICD-10-CM

## 2019-01-25 NOTE — Patient Instructions (Signed)
Cervical Sprain    A cervical sprain is a stretch or tear in the tissues that connect bones (ligaments) in the neck. Most neck (cervical) sprains get better in 4-6 weeks.  Follow these instructions at home:  If you have a neck collar:   Wear it as told by your doctor. Do not take off (do not remove) the collar unless your doctor says that this is safe.   Ask your doctor before adjusting your collar.   If you have long hair, keep it outside of the collar.   Ask your doctor if you may take off the collar for cleaning and bathing. If you may take off the collar:  ? Follow instructions from your doctor about how to take off the collar safely.  ? Clean the collar by wiping it with mild soap and water. Let it air-dry all the way.  ? If your collar has removable pads:   Take the pads out every 1-2 days.   Hand wash the pads with soap and water.   Let the pads air-dry all the way before you put them back in the collar. Do not dry them in a clothes dryer. Do not dry them with a hair dryer.  ? Check your skin under the collar for irritation or sores. If you see any, tell your doctor.  Managing pain, stiffness, and swelling     Use a cervical traction device, if told by your doctor.   If told, put heat on the affected area. Do this before exercises (physical therapy) or as often as told by your doctor. Use the heat source that your doctor recommends, such as a moist heat pack or a heating pad.  ? Place a towel between your skin and the heat source.  ? Leave the heat on for 20-30 minutes.  ? Take the heat off (remove the heat) if your skin turns bright red. This is very important if you cannot feel pain, heat, or cold. You may have a greater risk of getting burned.   Put ice on the affected area.  ? Put ice in a plastic bag.  ? Place a towel between your skin and the bag.  ? Leave the ice on for 20 minutes, 2-3 times a day.  Activity   Do not drive while wearing a neck collar. If you do not have a neck collar, ask  your doctor if it is safe to drive.   Do not drive or use heavy machinery while taking prescription pain medicine or muscle relaxants, unless your doctor approves.   Do not lift anything that is heavier than 10 lb (4.5 kg) until your doctor tells you that it is safe.   Rest as told by your doctor.   Avoid activities that make you feel worse. Ask your doctor what activities are safe for you.   Do exercises as told by your doctor or physical therapist.  Preventing neck sprain   Practice good posture. Adjust your workstation to help with this, if needed.   Exercise regularly as told by your doctor or physical therapist.   Avoid activities that are risky or may cause a neck sprain (cervical sprain).  General instructions   Take over-the-counter and prescription medicines only as told by your doctor.   Do not use any products that contain nicotine or tobacco. This includes cigarettes and e-cigarettes. If you need help quitting, ask your doctor.   Keep all follow-up visits as told by your doctor. This is important.    Contact a doctor if:   You have pain or other symptoms that get worse.   You have symptoms that do not get better after 2 weeks.   You have pain that does not get better with medicine.   You start to have new, unexplained symptoms.   You have sores or irritated skin from wearing your neck collar.  Get help right away if:   You have very bad pain.   You have any of the following in any part of your body:  ? Loss of feeling (numbness).  ? Tingling.  ? Weakness.   You cannot move a part of your body (you have paralysis).   Your activity level does not improve.  Summary   A cervical sprain is a stretch or tear in the tissues that connect bones (ligaments) in the neck.   If you have a neck (cervical) collar, do not take off the collar unless your doctor says that this is safe.   Put ice on affected areas as told by your doctor.   Put heat on affected areas as told by your doctor.   Good  posture and regular exercise can help prevent a neck sprain from happening again.  This information is not intended to replace advice given to you by your health care provider. Make sure you discuss any questions you have with your health care provider.  Document Released: 03/11/2008 Document Revised: 06/04/2016 Document Reviewed: 06/04/2016  Elsevier Interactive Patient Education  2019 [employer]vier Inc[employer]
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
## 2019-01-26 ENCOUNTER — Ambulatory Visit (INDEPENDENT_AMBULATORY_CARE_PROVIDER_SITE_OTHER): Payer: BLUE CROSS/BLUE SHIELD | Admitting: Physician Assistant

## 2019-01-26 ENCOUNTER — Encounter (INDEPENDENT_AMBULATORY_CARE_PROVIDER_SITE_OTHER): Payer: Self-pay | Admitting: Physician Assistant

## 2019-01-26 DIAGNOSIS — Z6841 Body Mass Index (BMI) 40.0 and over, adult: Secondary | ICD-10-CM

## 2019-01-26 DIAGNOSIS — E559 Vitamin D deficiency, unspecified: Secondary | ICD-10-CM | POA: Diagnosis not present

## 2019-01-26 DIAGNOSIS — R7303 Prediabetes: Secondary | ICD-10-CM

## 2019-01-26 MED ORDER — METFORMIN HCL 500 MG PO TABS: 500.0000 mg | ORAL_TABLET | Freq: Every day | ORAL | 0 refills | Status: DC
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
## 2019-02-01 ENCOUNTER — Ambulatory Visit: Payer: BLUE CROSS/BLUE SHIELD | Admitting: Internal Medicine

## 2019-02-01 ENCOUNTER — Other Ambulatory Visit: Payer: Self-pay

## 2019-02-01 MED ORDER — TRAMADOL HCL 50 MG PO TABS: 50.0000 mg | ORAL_TABLET | Freq: Three times a day (TID) | ORAL | 0 refills | Status: DC | PRN

## 2019-02-01 NOTE — Progress Notes (Deleted)
Virtual Visit via Video Note  I connected with Wendy Wagner on 02/01/19 at 11:15 AM EDT by a video enabled telemedicine application and verified that I am speaking with the correct person using two identifiers.   I discussed the limitations of evaluation and management by telemedicine and the availability of in person appointments. The patient expressed understanding and agreed to proceed.  Patient Location: Provider Location: Office  History of Present Illness:     Past Medical History:  Diagnosis Date  . Anemia   . Back pain   . Childhood asthma   . GERD (gastroesophageal reflux disease)   . Heart murmur   . Leg edema     Current Outpatient Medications  Medication Sig Dispense Refill  . ibuprofen (ADVIL,MOTRIN) 800 MG tablet Take 1 tablet (800 mg total) by mouth every 8 (eight) hours as needed. 30 tablet 2  . metFORMIN (GLUCOPHAGE) 500 MG tablet Take 1 tablet (500 mg total) by mouth daily with breakfast. 30 tablet 0  . orphenadrine (NORFLEX) 100 MG tablet Take 1 tablet (100 mg total) by mouth 2 (two) times daily. 20 tablet 0  . traMADol (ULTRAM) 50 MG tablet Take 50 mg by mouth every 6 (six) hours as needed.   0  . triamcinolone cream (KENALOG) 0.1 % Apply 1 application topically 2 (two) times daily. 30 g 0  . Vitamin D, Ergocalciferol, (DRISDOL) 1.25 MG (50000 UT) CAPS capsule Take 1 capsule (50,000 Units total) by mouth every 7 (seven) days. 4 capsule 0   No current facility-administered medications for this visit.     Allergies  Allergen Reactions  . No Known Drug Allergy     Family History  Problem Relation Age of Onset  . Cancer Mother        brain  . Cancer Father        brain, lung  . Alcoholism Father   . Diabetes Neg Hx   . Heart disease Neg Hx   . Stroke Neg Hx     Social History   Socioeconomic History  . Marital status: Single    Spouse name: Not on file  . Number of children: Not on file  . Years of education: Not on file  . Highest education  level: Not on file  Occupational History  . Occupation: [employer]  . Financial resource strain: Not on file  . Food insecurity:    Worry: Not on file    Inability: Not on file  . Transportation needs:    Medical: Not on file    Non-medical: Not on file  Tobacco Use  . Smoking status: Former Smoker    Packs/day: 1.00    Years: 8.00    Pack years: 8.00    Types: Cigarettes  . Smokeless tobacco: Never Used  Substance and Sexual Activity  . Alcohol use: No    Alcohol/week: 0.0 standard drinks  . Drug use: No  . Sexual activity: Not Currently  Lifestyle  . Physical activity:    Days per week: Not on file    Minutes per session: Not on file  . Stress: Not on file  Relationships  . Social connections:    Talks on phone: Not on file    Gets together: Not on file    Attends religious service: Not on file    Active member of club or organization: Not on file    Attends meetings of clubs or organizations: Not on file    Relationship status: Not  on file  . Intimate partner violence:    Fear of current or ex partner: Not on file    Emotionally abused: Not on file    Physically abused: Not on file    Forced sexual activity: Not on file  Other Topics Concern  . Not on file  Social History Narrative  . Not on file     Constitutional: Denies fever, malaise, fatigue, headache or abrupt weight changes.  HEENT: Denies eye pain, eye redness, ear pain, ringing in the ears, wax buildup, runny nose, nasal congestion, bloody nose, or sore throat. Respiratory: Denies difficulty breathing, shortness of breath, cough or sputum production.   Cardiovascular: Denies chest pain, chest tightness, palpitations or swelling in the hands or feet.  Gastrointestinal: Denies abdominal pain, bloating, constipation, diarrhea or blood in the stool.  GU: Denies urgency, frequency, pain with urination, burning sensation, blood in urine, odor or discharge. Musculoskeletal: Denies decrease in range  of motion, difficulty with gait, muscle pain or joint pain and swelling.  Skin: Denies redness, rashes, lesions or ulcercations.  Neurological: Denies dizziness, difficulty with memory, difficulty with speech or problems with balance and coordination.  Psych: Denies anxiety, depression, SI/HI.  No other specific complaints in a complete review of systems (except as listed in HPI above).  LMP 05/22/2016  Wt Readings from Last 3 Encounters:  01/14/19 (!) 330 lb (149.7 kg)  12/16/18 (!) 325 lb (147.4 kg)  12/11/18 (!) 328 lb (148.8 kg)    General: Appears their stated age, well developed, well nourished in NAD. Skin: Warm, dry and intact. No rashes, lesions or ulcerations noted. HEENT: Head: normal shape and size; Eyes: sclera white, no icterus, conjunctiva pink, PERRLA and EOMs intact; Ears: Tm's gray and intact, normal light reflex; Nose: mucosa pink and moist, septum midline; Throat/Mouth: Teeth present, mucosa pink and moist, no exudate, lesions or ulcerations noted.  Neck:  Neck supple, trachea midline. No masses, lumps or thyromegaly present.  Cardiovascular: Normal rate and rhythm. S1,S2 noted.  No murmur, rubs or gallops noted. No JVD or BLE edema. No carotid bruits noted. Pulmonary/Chest: Normal effort and positive vesicular breath sounds. No respiratory distress. No wheezes, rales or ronchi noted.  Abdomen: Soft and nontender. Normal bowel sounds. No distention or masses noted. Liver, spleen and kidneys non palpable. Musculoskeletal: Normal range of motion. No signs of joint swelling. No difficulty with gait.  Neurological: Alert and oriented. Cranial nerves II-XII grossly intact. Coordination normal.  Psychiatric: Mood and affect normal. Behavior is normal. Judgment and thought content normal.   EKG:  BMET    Component Value Date/Time   NA 143 12/16/2018 0901   K 4.4 12/16/2018 0901   CL 104 12/16/2018 0901   CO2 21 12/16/2018 0901   GLUCOSE 97 12/16/2018 0901   GLUCOSE 90  05/30/2017 1510   BUN 14 12/16/2018 0901   CREATININE 0.93 12/16/2018 0901   CALCIUM 8.9 12/16/2018 0901   GFRNONAA 70 12/16/2018 0901   GFRAA 81 12/16/2018 0901    Lipid Panel     Component Value Date/Time   CHOL 174 06/04/2018 0938   TRIG 91 06/04/2018 0938   HDL 67 06/04/2018 0938   CHOLHDL 3 05/30/2017 1510   VLDL 24.8 05/30/2017 1510   LDLCALC 89 06/04/2018 0938    CBC    Component Value Date/Time   WBC 4.3 01/28/2018 1147   WBC 4.8 05/30/2017 1510   RBC 4.57 01/28/2018 1147   RBC 4.40 05/30/2017 1510   HGB 12.2 01/28/2018  1147   HCT 39.4 01/28/2018 1147   PLT 310.0 05/30/2017 1510   MCV 86 01/28/2018 1147   MCH 26.7 01/28/2018 1147   MCHC 31.0 (L) 01/28/2018 1147   MCHC 31.8 05/30/2017 1510   RDW 14.9 01/28/2018 1147   LYMPHSABS 2.0 01/28/2018 1147   EOSABS 0.2 01/28/2018 1147   BASOSABS 0.1 01/28/2018 1147    Hgb A1C Lab Results  Component Value Date   HGBA1C 5.8 (H) 12/16/2018        Assessment and Plan:   Follow Up Instructions:    I discussed the assessment and treatment plan with the patient. The patient was provided an opportunity to ask questions and all were answered. The patient agreed with the plan and demonstrated an understanding of the instructions.   The patient was advised to call back or seek an in-person evaluation if the symptoms worsen or if the condition fails to improve as anticipated.     Webb Silversmith, NP

## 2019-02-01 NOTE — Telephone Encounter (Signed)
Work note to return 02/08/2019... placed in the front office for pick up

## 2019-02-02 NOTE — Progress Notes (Signed)
Erroneous encounter

## 2019-02-05 ENCOUNTER — Telehealth: Payer: Self-pay | Admitting: Internal Medicine

## 2019-02-05 NOTE — Telephone Encounter (Signed)
Ok for work note? 

## 2019-02-05 NOTE — Telephone Encounter (Signed)
Best number [phone]  Pt called stating she just left therapy (PT) and they want her to stay another 2 weeks Pt needs work note

## 2019-02-08 NOTE — Telephone Encounter (Signed)
Letter printed.

## 2019-02-09 ENCOUNTER — Telehealth: Payer: Self-pay | Admitting: Internal Medicine

## 2019-02-09 DIAGNOSIS — M542 Cervicalgia: Secondary | ICD-10-CM

## 2019-02-09 DIAGNOSIS — M545 Low back pain, unspecified: Secondary | ICD-10-CM

## 2019-02-09 NOTE — Telephone Encounter (Signed)
Placed in the front office for pick up and pt is aware...   Pt wants a referral sent to [facility], previous ortho, as she was seen today for back pain... Pt states the physician you originally referred her to, Dr Len Childs, told her he can not assist with her back concerns as he only treat conditions/injuries related shoulders... Additionally, pt has insurance--Affordable Care Act that requires her to see [facility] physicians and pt is aware... Pt states Dr Minna Merritts office just need a referral for the back pain in relation to her visit today

## 2019-02-09 NOTE — Telephone Encounter (Signed)
Patient stated she would like a referral sent to [facility]. He is out of her network but she said she would just pay out of pocket cost.  She was referred to Dr Len Childs but stated they do not do neck and back.   Patient requested a call back BEST PHONE- [phone]

## 2019-02-10 NOTE — Telephone Encounter (Signed)
Referral placed.

## 2019-02-10 NOTE — Telephone Encounter (Signed)
Referral placed. Looks like this was for insurance purposes only.

## 2019-02-10 NOTE — Addendum Note (Signed)
Addended by: Jearld Fenton on: 02/10/2019 12:13 PM   Modules accepted: Orders

## 2019-02-11 ENCOUNTER — Other Ambulatory Visit: Payer: Self-pay

## 2019-02-11 ENCOUNTER — Ambulatory Visit (INDEPENDENT_AMBULATORY_CARE_PROVIDER_SITE_OTHER): Payer: BLUE CROSS/BLUE SHIELD | Admitting: Physician Assistant

## 2019-02-11 ENCOUNTER — Encounter (INDEPENDENT_AMBULATORY_CARE_PROVIDER_SITE_OTHER): Payer: Self-pay | Admitting: Physician Assistant

## 2019-02-11 DIAGNOSIS — E559 Vitamin D deficiency, unspecified: Secondary | ICD-10-CM | POA: Diagnosis not present

## 2019-02-11 DIAGNOSIS — R7303 Prediabetes: Secondary | ICD-10-CM | POA: Diagnosis not present

## 2019-02-11 DIAGNOSIS — Z6841 Body Mass Index (BMI) 40.0 and over, adult: Secondary | ICD-10-CM | POA: Diagnosis not present

## 2019-02-11 MED ORDER — VITAMIN D (ERGOCALCIFEROL) 1.25 MG (50000 UNIT) PO CAPS: 50000.0000 [IU] | ORAL_CAPSULE | ORAL | 1 refills | Status: DC

## 2019-02-11 MED ORDER — METFORMIN HCL 500 MG PO TABS: 500.0000 mg | ORAL_TABLET | Freq: Every day | ORAL | 1 refills | Status: DC

## 2019-02-12 NOTE — Telephone Encounter (Signed)
Spoke with the patient. She has the [place] . She went herself to Dr Alfonso Ramus at [place] [facility] [place], they are not part of that insurance plan either. I told her she really needs to find Dr;s in her network as she will get a lot of bills as they wont pay for out of network Dr;s. She is doing PT right now and wants me to find her a back Dr at [facility] but she just wants the name she didn't want me to set her up an appt. Will call her back with a name.

## 2019-02-15 NOTE — Progress Notes (Signed)
Office: [phone]  /  Fax: [phone] TeleHealth Visit:  Wendy Wagner has verbally consented to this TeleHealth visit today. The patient is located at home, the provider is located at the [employer] [facility] office. The participants in this visit include the listed provider and patient. The visit was conducted today via Webex.  HPI:   Chief Complaint: OBESITY Wendy Wagner is here to discuss her progress with her obesity treatment plan. She is keeping a food journal with 1500 calories and 95 grams of protein daily and is following her eating plan approximately 0% of the time. She states she is exercising 0 minutes 0 times per week. Wendy Wagner reports that she continues to have trouble eating dinner daily due to lack of hunger. She is eating a large lunch and, therefore, is not hungry in the evening. She also reports she is not journaling as often. We were unable to weigh the patient today for this TeleHealth visit. She feels as if she has maintained her weight since her last visit. She has lost 15 lbs since starting treatment with [place].  Vitamin D deficiency Wendy Wagner has a diagnosis of Vitamin D deficiency. She is currently taking prescription Vit D and denies nausea, vomiting or muscle weakness.  Pre-Diabetes Wendy Wagner has a diagnosis of prediabetes based on her elevated Hgb A1c and was informed this puts her at greater risk of developing diabetes. She is taking metformin currently and continues to work on diet and exercise to decrease risk of diabetes. She denies nausea, vomiting, or diarrhea. No polyphagia.  ASSESSMENT AND PLAN:  Vitamin D deficiency - Plan: Vitamin D, Ergocalciferol, (DRISDOL) 1.25 MG (50000 UT) CAPS capsule  Prediabetes - Plan: metFORMIN (GLUCOPHAGE) 500 MG tablet  Class 3 severe obesity with serious comorbidity and body mass index (BMI) of 50.0 to 59.9 in adult, unspecified obesity type ([place])  PLAN:  Vitamin D Deficiency Wendy Wagner was informed that low Vitamin D  levels contributes to fatigue and are associated with obesity, breast, and colon cancer. She agrees to continue to take prescription Vit D @ 50,000 IU every week #4 with 1 refill and will follow-up for routine testing of Vitamin D, at least 2-3 times per year. She was informed of the risk of over-replacement of Vitamin D and agrees to not increase her dose unless she discusses this with [place] first. Wendy Wagner agrees to follow-up with our clinic in 2 weeks.  Pre-Diabetes Wendy Wagner will continue to work on weight loss, exercise, and decreasing simple carbohydrates in her diet to help decrease the risk of diabetes. We dicussed metformin including benefits and risks. She was informed that eating too many simple carbohydrates or too many calories at one sitting increases the likelihood of GI side effects. Wendy Wagner is currently taking metformin and a refill prescription was written today for #30 with 1 refill. Wendy Wagner agrees to follow-up with our clinic in 2 weeks.  Obesity Wendy Wagner is currently in the action stage of change. As such, her goal is to continue with weight loss efforts. She has agreed to follow the Category 3 plan and journal 1500 calories + 95 grams of protein daily. Wendy Wagner has been instructed to work up to a goal of 150 minutes of combined cardio and strengthening exercise per week for weight loss and overall health benefits. We discussed the following Behavioral Modification Strategies today: increasing lean protein intake and no skipping meals.  Wendy Wagner has agreed to follow-up with our clinic in 2 weeks. She was informed of the importance of frequent follow-up visits  to maximize her success with intensive lifestyle modifications for her multiple health conditions.  ALLERGIES: Allergies  Allergen Reactions  . No Known Drug Allergy     MEDICATIONS: Current Outpatient Medications on File Prior to Visit  Medication Sig Dispense Refill  . ibuprofen (ADVIL,MOTRIN) 800 MG tablet Take 1 tablet  (800 mg total) by mouth every 8 (eight) hours as needed. 30 tablet 2  . orphenadrine (NORFLEX) 100 MG tablet Take 1 tablet (100 mg total) by mouth 2 (two) times daily. 20 tablet 0  . traMADol (ULTRAM) 50 MG tablet Take 1 tablet (50 mg total) by mouth every 8 (eight) hours as needed. 50 tablet 0  . triamcinolone cream (KENALOG) 0.1 % Apply 1 application topically 2 (two) times daily. 30 g 0   No current facility-administered medications on file prior to visit.     PAST MEDICAL HISTORY: Past Medical History:  Diagnosis Date  . Anemia   . Back pain   . Childhood asthma   . GERD (gastroesophageal reflux disease)   . Heart murmur   . Leg edema     PAST SURGICAL HISTORY: Past Surgical History:  Procedure Laterality Date  . CESAREAN SECTION    . WISDOM TOOTH EXTRACTION      SOCIAL HISTORY: Social History   Tobacco Use  . Smoking status: Former Smoker    Packs/day: 1.00    Years: 8.00    Pack years: 8.00    Types: Cigarettes  . Smokeless tobacco: Never Used  Substance Use Topics  . Alcohol use: No    Alcohol/week: 0.0 standard drinks  . Drug use: No    FAMILY HISTORY: Family History  Problem Relation Age of Onset  . Cancer Mother        brain  . Cancer Father        brain, lung  . Alcoholism Father   . Diabetes Neg Hx   . Heart disease Neg Hx   . Stroke Neg Hx    ROS: Review of Systems  Gastrointestinal: Negative for diarrhea, nausea and vomiting.  Musculoskeletal:       Negative for muscle weakness.  Endo/Heme/Allergies:       Negative for polyphagia.   PHYSICAL EXAM: Pt in no acute distress  RECENT LABS AND TESTS: BMET    Component Value Date/Time   NA 143 12/16/2018 0901   K 4.4 12/16/2018 0901   CL 104 12/16/2018 0901   CO2 21 12/16/2018 0901   GLUCOSE 97 12/16/2018 0901   GLUCOSE 90 05/30/2017 1510   BUN 14 12/16/2018 0901   CREATININE 0.93 12/16/2018 0901   CALCIUM 8.9 12/16/2018 0901   GFRNONAA 70 12/16/2018 0901   GFRAA 81 12/16/2018 0901    Lab Results  Component Value Date   HGBA1C 5.8 (H) 12/16/2018   HGBA1C 5.8 (H) 06/04/2018   HGBA1C 5.9 (H) 01/28/2018   HGBA1C 6.2 05/30/2017   HGBA1C 6.2 05/29/2016   Lab Results  Component Value Date   INSULIN 37.0 (H) 12/16/2018   INSULIN 21.9 06/04/2018   INSULIN 20.4 01/28/2018   CBC    Component Value Date/Time   WBC 4.3 01/28/2018 1147   WBC 4.8 05/30/2017 1510   RBC 4.57 01/28/2018 1147   RBC 4.40 05/30/2017 1510   HGB 12.2 01/28/2018 1147   HCT 39.4 01/28/2018 1147   PLT 310.0 05/30/2017 1510   MCV 86 01/28/2018 1147   MCH 26.7 01/28/2018 1147   MCHC 31.0 (L) 01/28/2018 1147   MCHC 31.8 05/30/2017  1510   RDW 14.9 01/28/2018 1147   LYMPHSABS 2.0 01/28/2018 1147   EOSABS 0.2 01/28/2018 1147   BASOSABS 0.1 01/28/2018 1147   Iron/TIBC/Ferritin/ %Sat No results found for: IRON, TIBC, FERRITIN, IRONPCTSAT Lipid Panel     Component Value Date/Time   CHOL 174 06/04/2018 0938   TRIG 91 06/04/2018 0938   HDL 67 06/04/2018 0938   CHOLHDL 3 05/30/2017 1510   VLDL 24.8 05/30/2017 1510   LDLCALC 89 06/04/2018 0938   Hepatic Function Panel     Component Value Date/Time   PROT 6.9 12/16/2018 0901   ALBUMIN 4.1 12/16/2018 0901   AST 18 12/16/2018 0901   ALT 14 12/16/2018 0901   ALKPHOS 77 12/16/2018 0901   BILITOT 0.3 12/16/2018 0901      Component Value Date/Time   TSH 0.888 01/28/2018 1147   TSH 0.79 02/23/2015 1512   Results for JAZMYN, OFFNER (MRN [id number]) as of 02/15/2019 09:53  Ref. Range 12/16/2018 09:01  Vitamin D, 25-Hydroxy Latest Ref Range: 30.0 - 100.0 ng/mL 37.3   I, Michaelene Song, am acting as Location manager for [employer], PA-C I, Abby Potash, PA-C have reviewed above note and agree with its content

## 2019-03-02 ENCOUNTER — Encounter (INDEPENDENT_AMBULATORY_CARE_PROVIDER_SITE_OTHER): Payer: Self-pay | Admitting: Physician Assistant

## 2019-03-02 ENCOUNTER — Ambulatory Visit (INDEPENDENT_AMBULATORY_CARE_PROVIDER_SITE_OTHER): Payer: BLUE CROSS/BLUE SHIELD | Admitting: Physician Assistant

## 2019-03-02 ENCOUNTER — Other Ambulatory Visit: Payer: Self-pay

## 2019-03-02 DIAGNOSIS — E559 Vitamin D deficiency, unspecified: Secondary | ICD-10-CM | POA: Diagnosis not present

## 2019-03-02 DIAGNOSIS — Z6841 Body Mass Index (BMI) 40.0 and over, adult: Secondary | ICD-10-CM
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
## 2019-04-01 IMAGING — DX DG SHOULDER 2+V*L*
3 series · 3 of 3 positions shown · non-contrast
Comparison: None.

CLINICAL DATA: Chronic bilateral shoulder pain.

EXAM:
LEFT SHOULDER - 2+ VIEW

[shoulder axial]
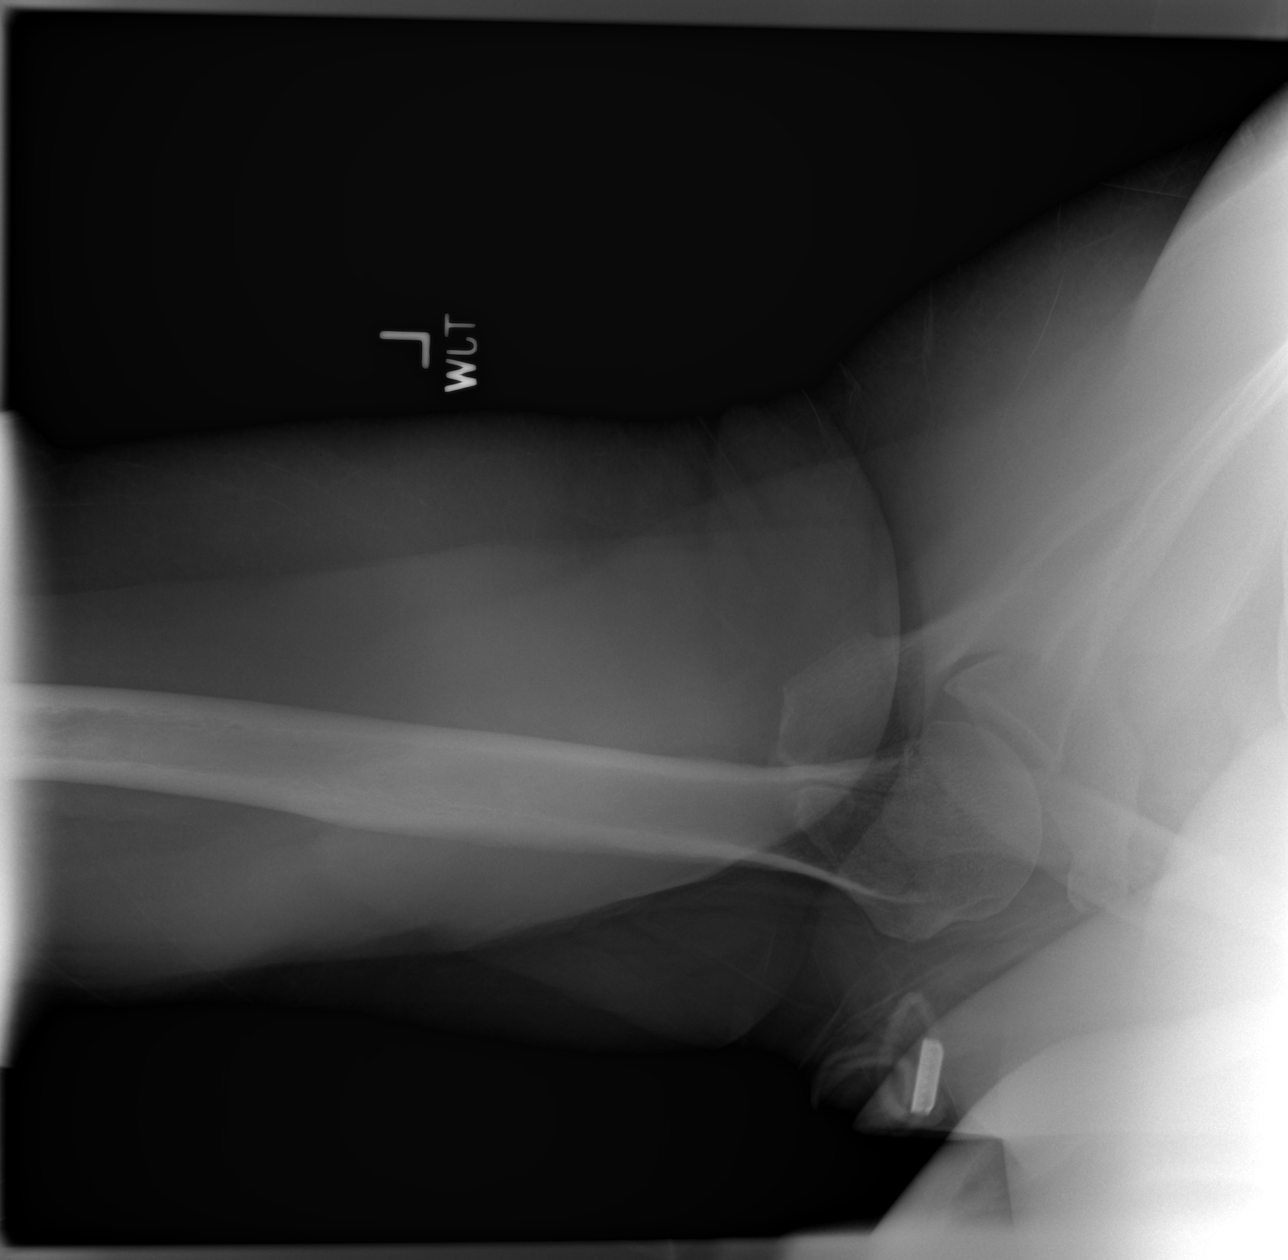

[shoulder ap]
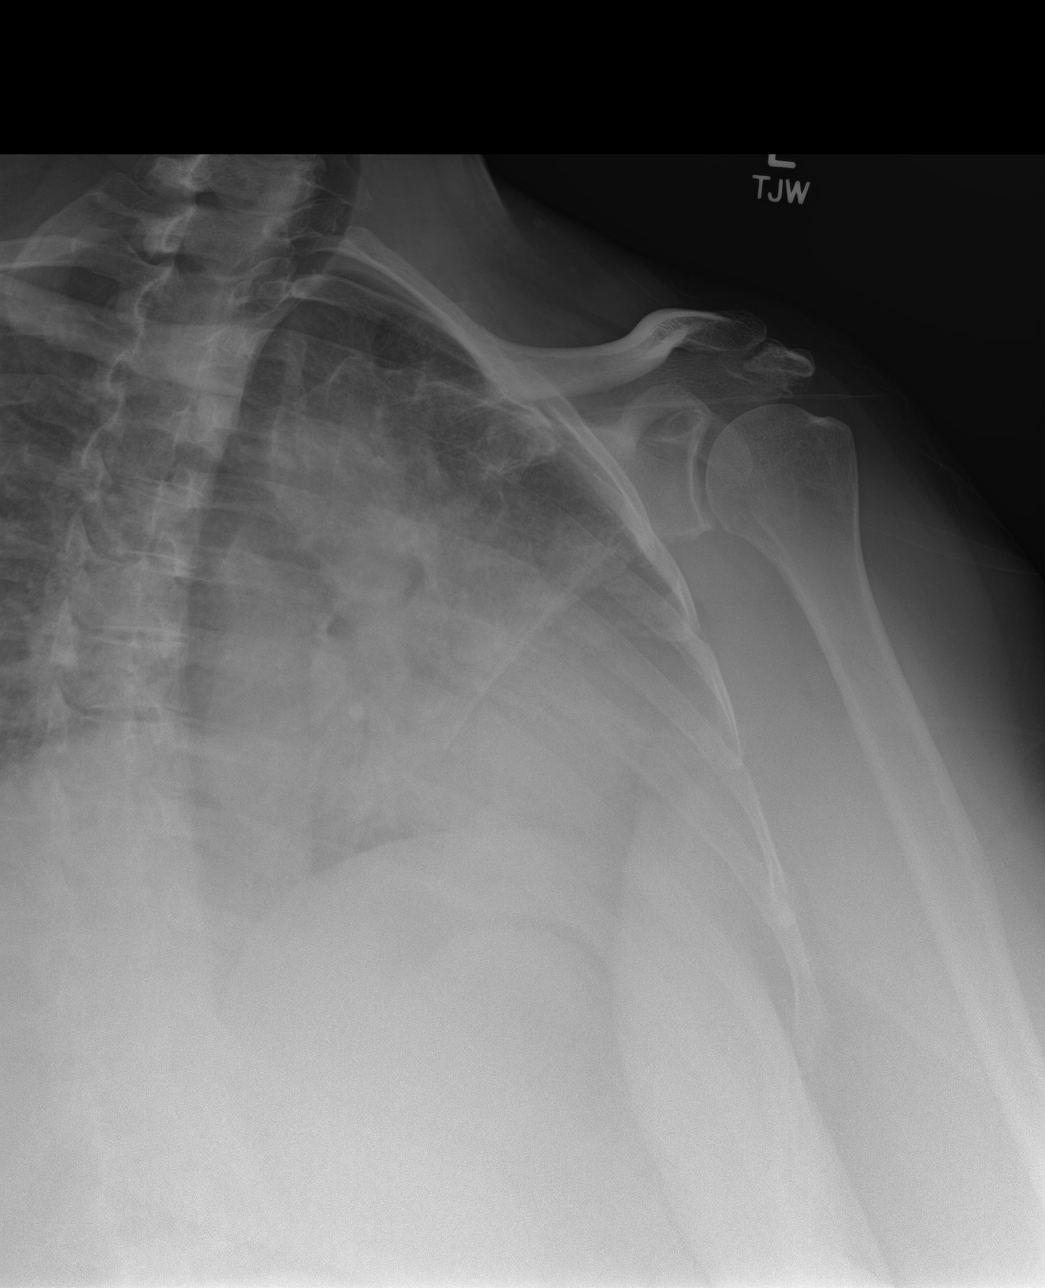

[shoulder y-view]
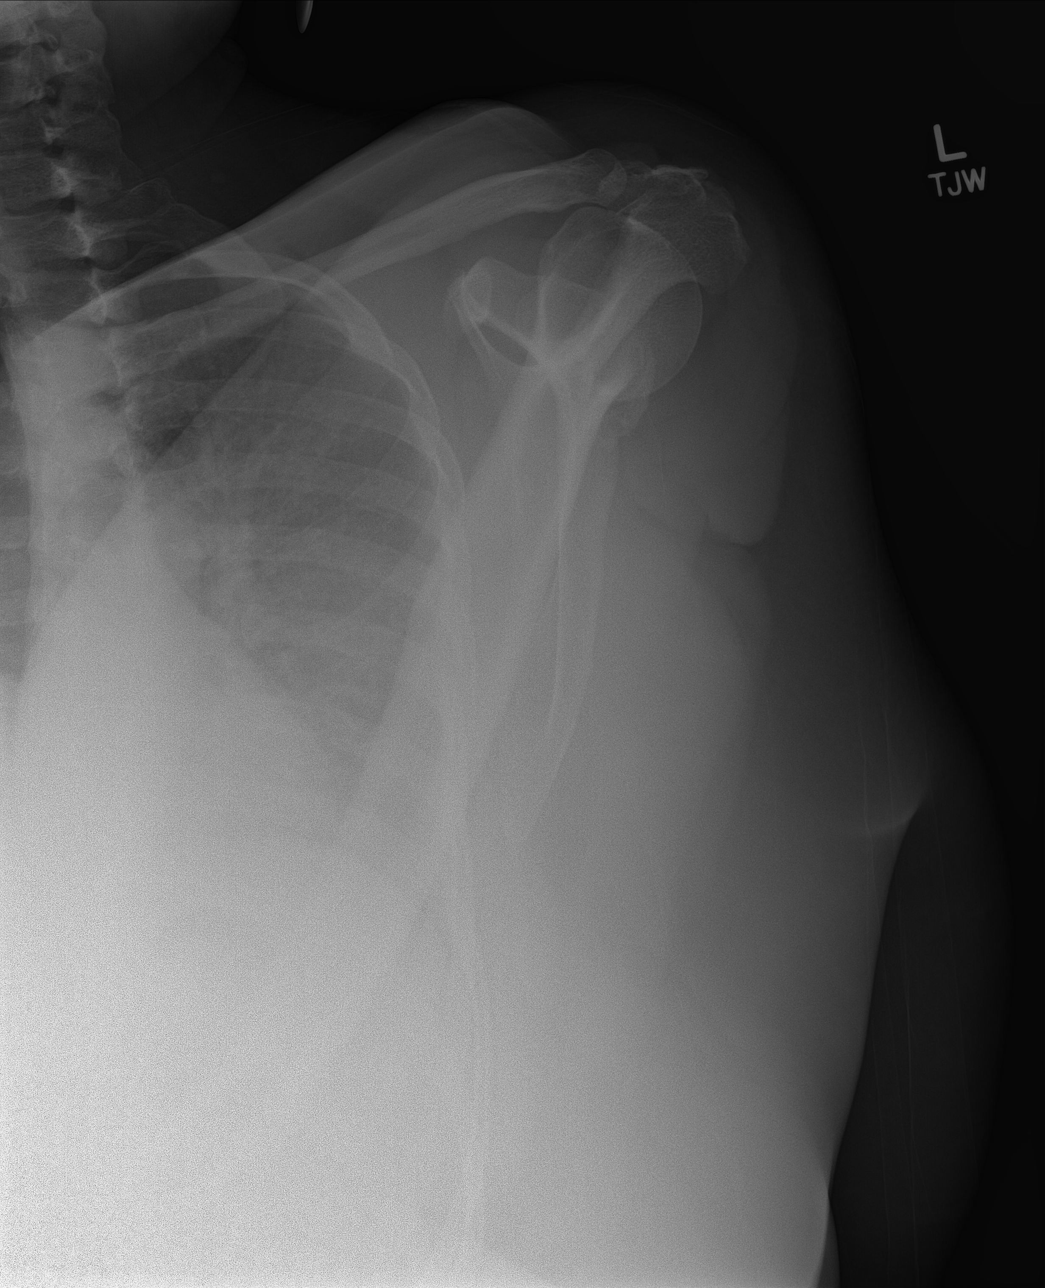

[3 of 3 positions shown; findings below may reference images not displayed]

FINDINGS: There is no evidence of fracture or dislocation. Suspected os
acromiale. Mild acromioclavicular spurring. Soft tissues are
unremarkable.
IMPRESSION: Mild acromioclavicular degenerative change. Suspected os acromiale.

## 2019-08-22 ENCOUNTER — Other Ambulatory Visit: Payer: Self-pay

## 2019-08-22 ENCOUNTER — Encounter (HOSPITAL_COMMUNITY): Payer: Self-pay | Admitting: Emergency Medicine

## 2019-08-22 ENCOUNTER — Ambulatory Visit (HOSPITAL_COMMUNITY)
Admission: EM | Admit: 2019-08-22 | Discharge: 2019-08-22 | Disposition: A | Discharge status: Home / Self Care | Payer: BLUE CROSS/BLUE SHIELD | Attending: Emergency Medicine | Admitting: Emergency Medicine

## 2019-08-22 DIAGNOSIS — N898 Other specified noninflammatory disorders of vagina: Secondary | ICD-10-CM | POA: Insufficient documentation

## 2019-08-22 DIAGNOSIS — M545 Low back pain, unspecified: Secondary | ICD-10-CM

## 2019-08-22 MED ORDER — NAPROXEN 500 MG PO TABS: 500.0000 mg | ORAL_TABLET | Freq: Two times a day (BID) | ORAL | 0 refills | Status: DC
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
## 2019-08-22 NOTE — Discharge Instructions (Signed)
Naproxen twice a day to help with your back pain.  Light and regular activity as tolerated.  Sleep with pillow under your knees.   We will await your vaginal sample results to determine treatment of your vaginal itching.  Please establish with a primary care provider for recheck and management of your symptoms.

## 2019-08-22 NOTE — ED Triage Notes (Signed)
Pt c/o vaginal itching for the last week, states the itchines comes and goes. Also c/o L lower hip/back pain with movement. Pt also c/o rash on arms.

## 2019-08-23 LAB — CERVICOVAGINAL ANCILLARY ONLY
Bacterial vaginitis: NEGATIVE
Candida vaginitis: POSITIVE — AB

## 2019-08-25 ENCOUNTER — Telehealth (HOSPITAL_COMMUNITY): Payer: Self-pay | Admitting: Emergency Medicine

## 2019-08-25 MED ORDER — FLUCONAZOLE 150 MG PO TABS: 150.0000 mg | ORAL_TABLET | Freq: Once | ORAL | 0 refills | Status: AC

## 2019-08-25 NOTE — Telephone Encounter (Signed)
Test for candida (yeast) was positive.  Prescription for fluconazole 150mg  po now, repeat dose in 3d if needed, #2 no refills, sent to the pharmacy of record.  Recheck or followup with PCP for further evaluation if symptoms are not improving.    Patient contacted and made aware of    results. Pt verbalized understanding and had all questions answered.

## 2019-10-09 IMAGING — US US ABDOMEN LIMITED
1 series · 9 of 9 positions shown · non-contrast
Comparison: None.

CLINICAL DATA: Right Peri umbilical mass demonstrating firmness but
also mobile. Possible lipoma.

EXAM:
ULTRASOUND ABDOMEN LIMITED

[Series 1: us abdomen limited · 0.10mm/px · 9 of 9 slices shown]
[im 1/9]
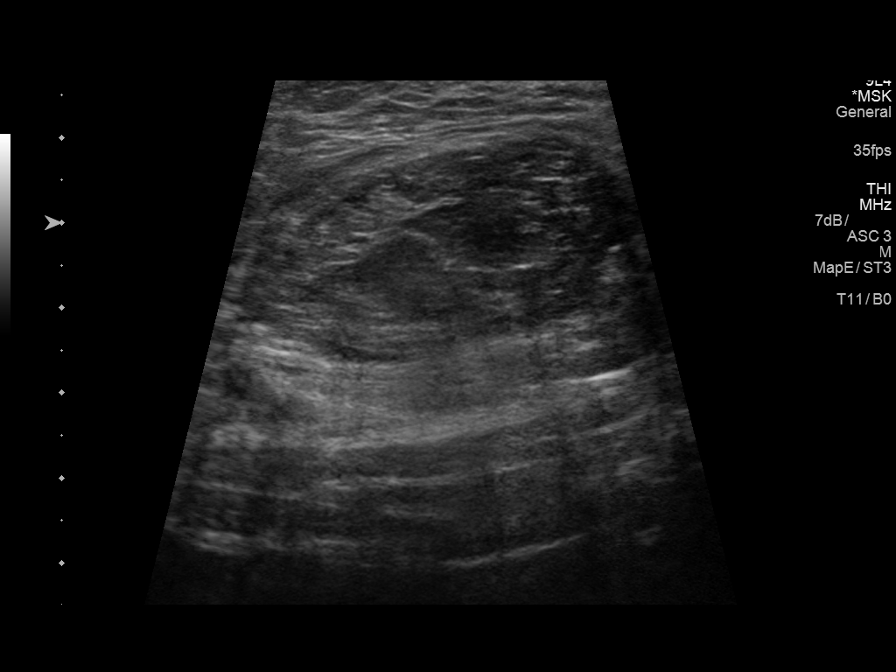
[im 2/9]
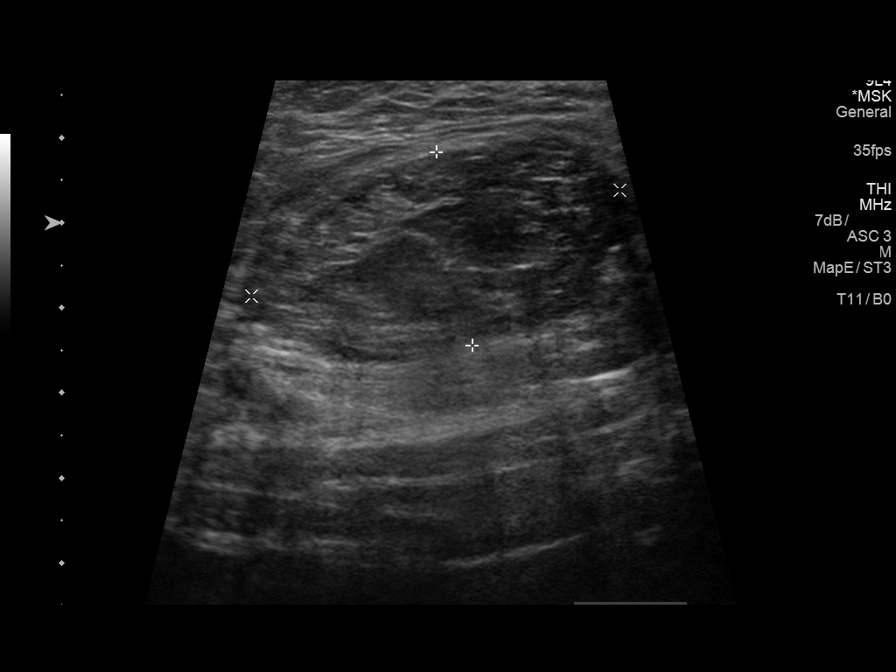
[im 3/9]
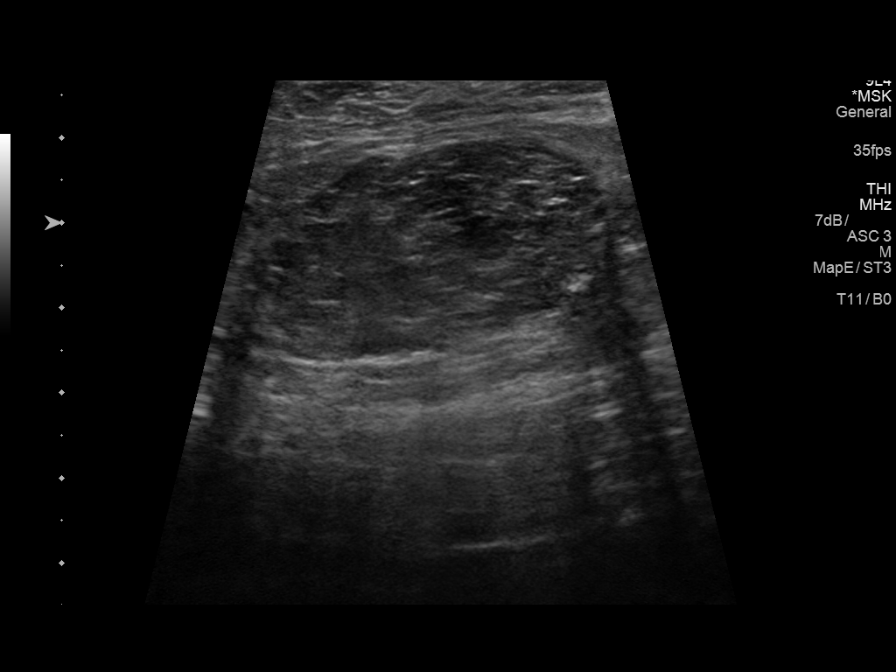
[im 4/9]
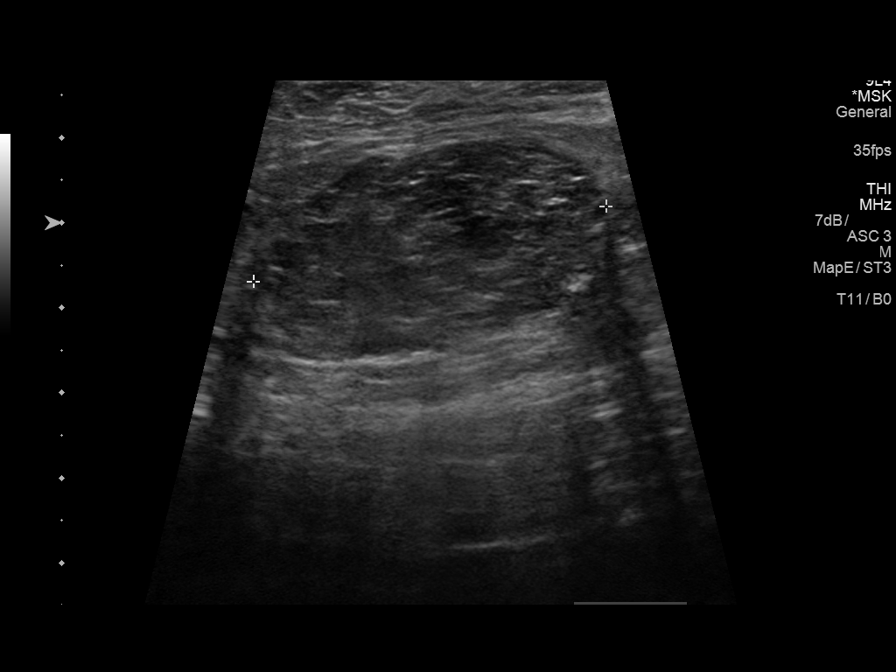
[im 5/9]
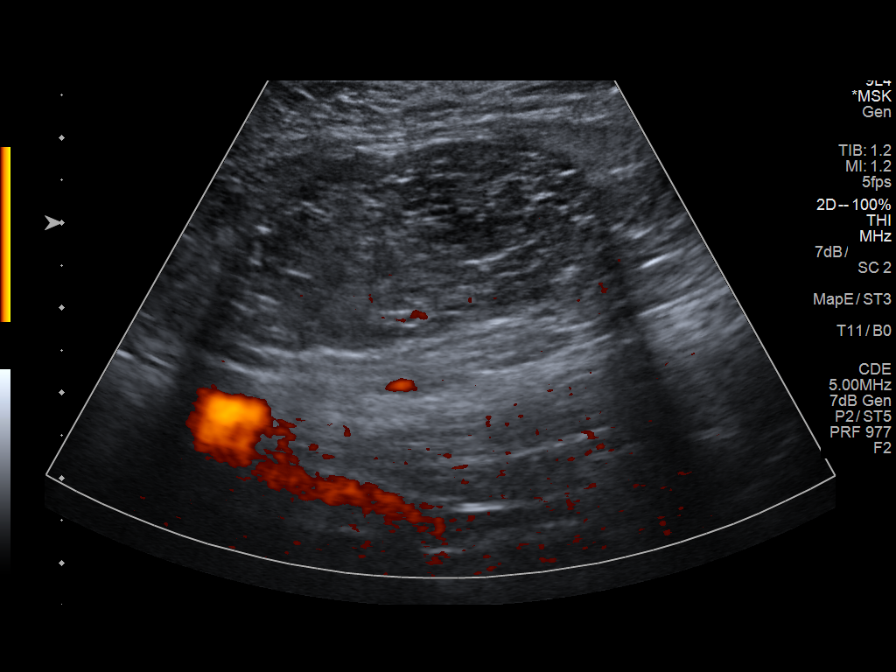
[im 6/9]
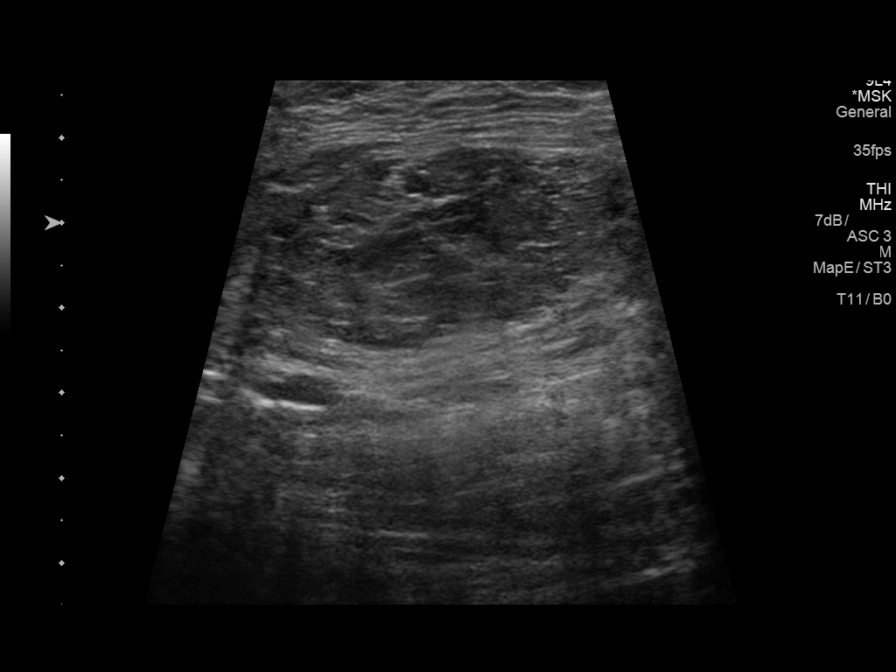
[im 7/9]
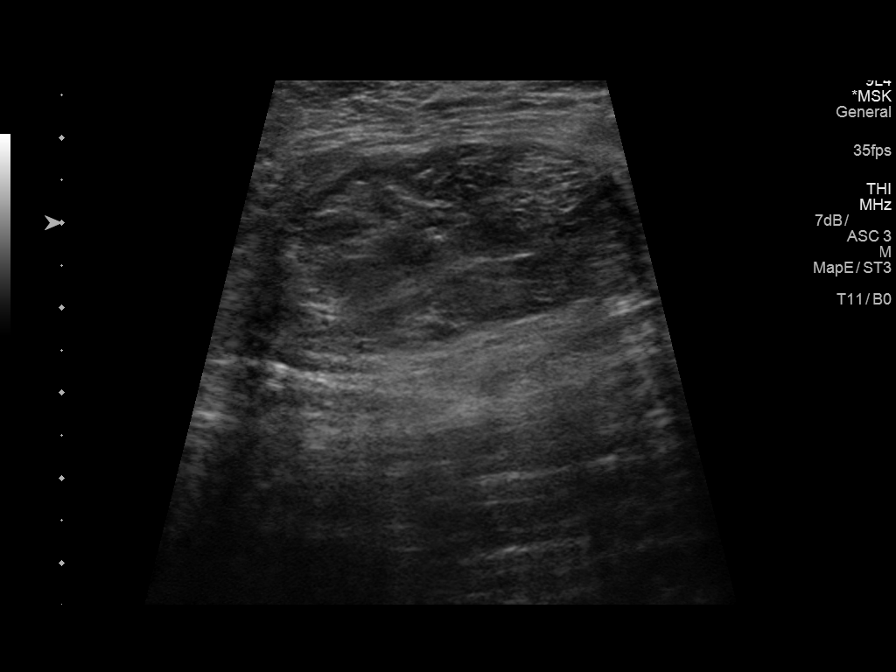
[im 8/9]
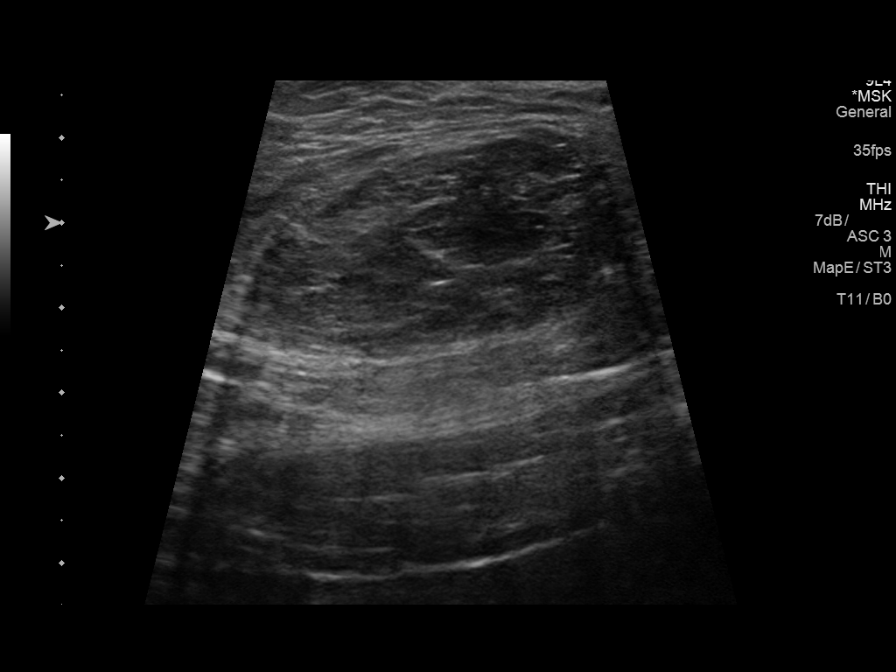
[im 9/9]
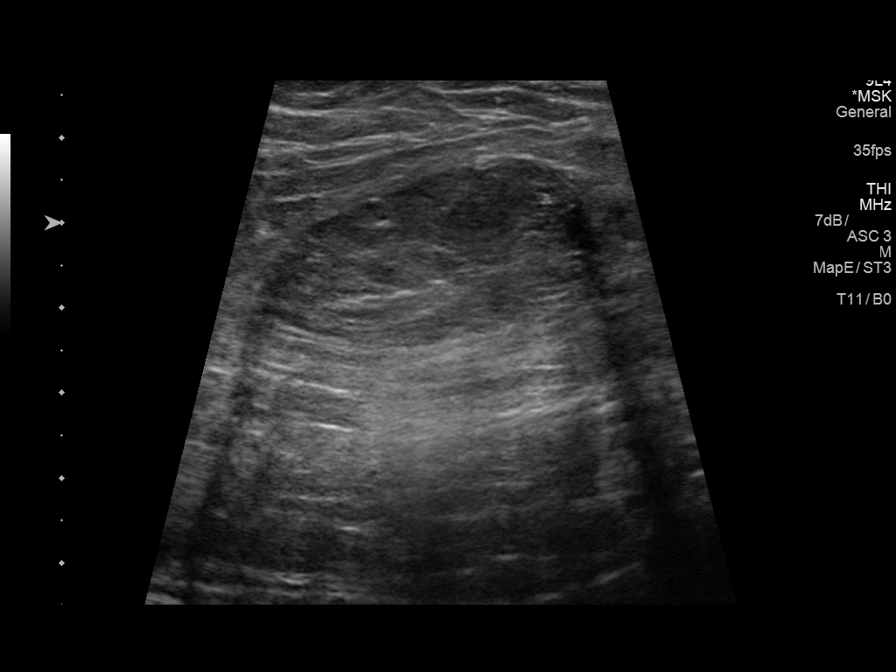

[9 of 9 positions shown; findings below may reference images not displayed]

FINDINGS: In the right periumbilical region there is a heterogeneous
echotexture structure which is reasonably well-marginated. It
measures 4.2 x 2.3 x 4.5 cm. It does not appear to have cystic
components and it is not hypervascular. No connection to the
peritoneal cavity is observed.
IMPRESSION: Well-circumscribed ovoid mixed echogenicity structure in the
subcutaneous fat of the right periumbilical region corresponding to
the clinical findings. This is not typical for a lipoma, hernia, or
enlarged lymph node. MRI of the abdominal wall would be the most
useful next imaging step.

## 2020-05-11 ENCOUNTER — Telehealth (INDEPENDENT_AMBULATORY_CARE_PROVIDER_SITE_OTHER): Payer: Self-pay | Admitting: Primary Care

## 2020-05-11 DIAGNOSIS — Z7689 Persons encountering health services in other specified circumstances: Secondary | ICD-10-CM

## 2020-05-11 DIAGNOSIS — Z6841 Body Mass Index (BMI) 40.0 and over, adult: Secondary | ICD-10-CM
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
## 2020-05-15 ENCOUNTER — Ambulatory Visit (INDEPENDENT_AMBULATORY_CARE_PROVIDER_SITE_OTHER): Payer: Self-pay | Admitting: Primary Care

## 2020-05-15 ENCOUNTER — Other Ambulatory Visit: Payer: Self-pay

## 2020-05-15 ENCOUNTER — Encounter (INDEPENDENT_AMBULATORY_CARE_PROVIDER_SITE_OTHER): Payer: Self-pay | Admitting: Primary Care

## 2020-05-15 ENCOUNTER — Encounter: Payer: Self-pay | Admitting: Gastroenterology

## 2020-05-15 VITALS — BP 119/68 | HR 66 | Temp 97.8°F | Ht 67.0 in | Wt 351.6 lb

## 2020-05-15 DIAGNOSIS — Z0000 Encounter for general adult medical examination without abnormal findings: Secondary | ICD-10-CM

## 2020-05-15 DIAGNOSIS — R7303 Prediabetes: Secondary | ICD-10-CM

## 2020-05-15 DIAGNOSIS — Z1231 Encounter for screening mammogram for malignant neoplasm of breast: Secondary | ICD-10-CM

## 2020-05-15 DIAGNOSIS — G8921 Chronic pain due to trauma: Secondary | ICD-10-CM

## 2020-05-15 DIAGNOSIS — Z1211 Encounter for screening for malignant neoplasm of colon: Secondary | ICD-10-CM

## 2020-05-15 NOTE — Patient Instructions (Signed)
Prediabetes Prediabetes is the condition of having a blood sugar (blood glucose) level that is higher than it should be, but not high enough for you to be diagnosed with type 2 diabetes. Having prediabetes puts you at risk for developing type 2 diabetes (type 2 diabetes mellitus). Prediabetes may be called impaired glucose tolerance or impaired fasting glucose. Prediabetes usually does not cause symptoms. Your health care provider can diagnose this condition with blood tests. You may be tested for prediabetes if you are overweight and if you have at least one other risk factor for prediabetes. What is blood glucose, and how is it measured? Blood glucose refers to the amount of glucose in your bloodstream. Glucose comes from eating foods that contain sugars and starches (carbohydrates), which the body breaks down into glucose. Your blood glucose level may be measured in mg/dL (milligrams per deciliter) or mmol/L (millimoles per liter). Your blood glucose may be checked with one or more of the following blood tests:  A fasting blood glucose (FBG) test. You will not be allowed to eat (you will fast) for 8 hours or longer before a blood sample is taken. ? A normal range for FBG is 70-100 mg/dl (3.9-5.6 mmol/L).  An A1c (hemoglobin A1c) blood test. This test provides information about blood glucose control over the previous 2?3months.  An oral glucose tolerance test (OGTT). This test measures your blood glucose at two times: ? After fasting. This is your baseline level. ? Two hours after you drink a beverage that contains glucose. You may be diagnosed with prediabetes:  If your FBG is 100?125 mg/dL (5.6-6.9 mmol/L).  If your A1c level is 5.7?6.4%.  If your OGTT result is 140?199 mg/dL (7.8-11 mmol/L). These blood tests may be repeated to confirm your diagnosis. How can this condition affect me? The pancreas produces a hormone (insulin) that helps to move glucose from the bloodstream into cells.  When cells in the body do not respond properly to insulin that the body makes (insulin resistance), excess glucose builds up in the blood instead of going into cells. As a result, high blood glucose (hyperglycemia) can develop, which can cause many complications. Hyperglycemia is a symptom of prediabetes. Having high blood glucose for a long time is dangerous. Too much glucose in your blood can damage your nerves and blood vessels. Long-term damage can lead to complications from diabetes, which may include:  Heart disease.  Stroke.  Blindness.  Kidney disease.  Depression.  Poor circulation in the feet and legs, which could lead to surgical removal (amputation) in severe cases. What can increase my risk? Risk factors for prediabetes include:  Having a family member with type 2 diabetes.  Being overweight or obese.  Being older than age 45.  Being of American Indian, African-American, Hispanic/Latino, or Asian/Pacific Islander descent.  Having an inactive (sedentary) lifestyle.  Having a history of heart disease.  History of gestational diabetes or polycystic ovary syndrome (PCOS), in women.  Having low levels of good cholesterol (HDL-C) or high levels of blood fats (triglycerides).  Having high blood pressure. What actions can I take to prevent diabetes?      Be physically active. ? Do moderate-intensity physical activity for 30 or more minutes on 5 or more days of the week, or as much as told by your health care provider. This could be brisk walking, biking, or water aerobics. ? Ask your health care provider what activities are safe for you. A mix of physical activities may be best, such as   walking, swimming, cycling, and strength training.  Lose weight as told by your health care provider. ? Losing 5-7% of your body weight can reverse insulin resistance. ? Your health care provider can determine how much weight loss is best for you and can help you lose weight  safely.  Follow a healthy meal plan. This includes eating lean proteins, complex carbohydrates, fresh fruits and vegetables, low-fat dairy products, and healthy fats. ? Follow instructions from your health care provider about eating or drinking restrictions. ? Make an appointment to see a diet and nutrition specialist (registered dietitian) to help you create a healthy eating plan that is right for you.  Do not smoke or use any tobacco products, such as cigarettes, chewing tobacco, and e-cigarettes. If you need help quitting, ask your health care provider.  Take over-the-counter and prescription medicines as told by your health care provider. You may be prescribed medicines that help lower the risk of type 2 diabetes.  Keep all follow-up visits as told by your health care provider. This is important. Summary  Prediabetes is the condition of having a blood sugar (blood glucose) level that is higher than it should be, but not high enough for you to be diagnosed with type 2 diabetes.  Having prediabetes puts you at risk for developing type 2 diabetes (type 2 diabetes mellitus).  To help prevent type 2 diabetes, make lifestyle changes such as being physically active and eating a healthy diet. Lose weight as told by your health care provider. This information is not intended to replace advice given to you by your health care provider. Make sure you discuss any questions you have with your health care provider. Document Revised: 01/15/2019 Document Reviewed: 11/14/2015 Elsevier Patient Education  2020 Elsevier Inc.  
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
## 2020-06-01 ENCOUNTER — Ambulatory Visit (INDEPENDENT_AMBULATORY_CARE_PROVIDER_SITE_OTHER): Payer: 59 | Admitting: Primary Care

## 2020-06-01 ENCOUNTER — Other Ambulatory Visit: Payer: Self-pay

## 2020-06-01 ENCOUNTER — Other Ambulatory Visit (HOSPITAL_COMMUNITY)
Admission: RE | Admit: 2020-06-01 | Discharge: 2020-06-01 | Disposition: A | Discharge status: Home / Self Care | Payer: 59 | Source: Ambulatory Visit | Attending: Primary Care | Admitting: Primary Care

## 2020-06-01 VITALS — BP 119/77 | HR 65 | Temp 97.2°F | Ht 67.0 in | Wt 350.6 lb

## 2020-06-01 DIAGNOSIS — C530 Malignant neoplasm of endocervix: Secondary | ICD-10-CM | POA: Diagnosis present

## 2020-06-01 DIAGNOSIS — R7303 Prediabetes: Secondary | ICD-10-CM

## 2020-06-01 DIAGNOSIS — Z113 Encounter for screening for infections with a predominantly sexual mode of transmission: Secondary | ICD-10-CM | POA: Insufficient documentation

## 2020-06-01 DIAGNOSIS — M545 Low back pain, unspecified: Secondary | ICD-10-CM

## 2020-06-01 DIAGNOSIS — Z13228 Encounter for screening for other metabolic disorders: Secondary | ICD-10-CM

## 2020-06-01 LAB — POCT URINALYSIS DIP (CLINITEK)
Bilirubin, UA: NEGATIVE
Blood, UA: NEGATIVE
Glucose, UA: NEGATIVE mg/dL
Ketones, POC UA: NEGATIVE mg/dL
Leukocytes, UA: NEGATIVE
Nitrite, UA: NEGATIVE
Spec Grav, UA: 1.025 (ref 1.010–1.025)
Urobilinogen, UA: 1 E.U./dL
pH, UA: 6 (ref 5.0–8.0)

## 2020-06-01 LAB — POCT GLYCOSYLATED HEMOGLOBIN (HGB A1C): Hemoglobin A1C: 6 % — AB (ref 4.0–5.6)

## 2020-06-01 LAB — GLUCOSE, POCT (MANUAL RESULT ENTRY): POC Glucose: 106 mg/dl — AB (ref 70–99)

## 2020-06-01 NOTE — Progress Notes (Signed)
I   Established Patient Office Visit  Subjective:  Patient ID: Wendy Wagner, female    DOB: January 16, 1966  Age: 54 y.o. MRN: [id number]  CC:  Chief Complaint  Patient presents with  . Gynecologic Exam    Pt. is here for a pap smear exam.    HPI [name] a 54 year old morbid obese female  presents for gyn visit  Past Medical History:  Diagnosis Date  . Anemia   . Back pain   . Childhood asthma   . GERD (gastroesophageal reflux disease)   . Heart murmur   . Leg edema     Past Surgical History:  Procedure Laterality Date  . CESAREAN SECTION    . WISDOM TOOTH EXTRACTION      Family History  Problem Relation Age of Onset  . Cancer Mother        brain  . Cancer Father        brain, lung  . Alcoholism Father   . Diabetes Neg Hx   . Heart disease Neg Hx   . Stroke Neg Hx     Social History   Socioeconomic History  . Marital status: Single    Spouse name: Not on file  . Number of children: Not on file  . Years of education: Not on file  . Highest education level: Not on file  Occupational History  . Occupation: Cook  Tobacco Use  . Smoking status: Former Smoker    Packs/day: 1.00    Years: 8.00    Pack years: 8.00    Types: Cigarettes  . Smokeless tobacco: Never Used  Substance and Sexual Activity  . Alcohol use: No    Alcohol/week: 0.0 standard drinks  . Drug use: No  . Sexual activity: Not Currently  Other Topics Concern  . Not on file  Social History Narrative  . Not on file   Social Determinants of Health   Financial Resource Strain:   . Difficulty of Paying Living Expenses: Not on file  Food Insecurity:   . Worried About Charity fundraiser in the Last Year: Not on file  . Ran Out of Food in the Last Year: Not on file  Transportation Needs:   . Lack of Transportation (Medical): Not on file  . Lack of Transportation (Non-Medical): Not on file  Physical Activity:   . Days of Exercise per Week: Not on file  . Minutes of Exercise  per Session: Not on file  Stress:   . Feeling of Stress : Not on file  Social Connections:   . Frequency of Communication with Friends and Family: Not on file  . Frequency of Social Gatherings with Friends and Family: Not on file  . Attends Religious Services: Not on file  . Active Member of Clubs or Organizations: Not on file  . Attends Archivist Meetings: Not on file  . Marital Status: Not on file  Intimate Partner Violence:   . Fear of Current or Ex-Partner: Not on file  . Emotionally Abused: Not on file  . Physically Abused: Not on file  . Sexually Abused: Not on file    Outpatient Medications Prior to Visit  Medication Sig Dispense Refill  . ibuprofen (ADVIL,MOTRIN) 800 MG tablet Take 1 tablet (800 mg total) by mouth every 8 (eight) hours as needed. 30 tablet 2  . traMADol (ULTRAM) 50 MG tablet Take 1 tablet (50 mg total) by mouth every 8 (eight) hours as needed. 50 tablet 0  .  Vitamin D, Ergocalciferol, (DRISDOL) 1.25 MG (50000 UT) CAPS capsule Take 1 capsule (50,000 Units total) by mouth every 7 (seven) days. 4 capsule 1  . metFORMIN (GLUCOPHAGE) 500 MG tablet Take 1 tablet (500 mg total) by mouth daily with breakfast. (Patient not taking: Reported on 06/01/2020) 30 tablet 1  . naproxen (NAPROSYN) 500 MG tablet Take 1 tablet (500 mg total) by mouth 2 (two) times daily. (Patient not taking: Reported on 06/01/2020) 30 tablet 0  . orphenadrine (NORFLEX) 100 MG tablet Take 1 tablet (100 mg total) by mouth 2 (two) times daily. (Patient not taking: Reported on 06/01/2020) 20 tablet 0  . triamcinolone cream (KENALOG) 0.1 % Apply 1 application topically 2 (two) times daily. (Patient not taking: Reported on 06/01/2020) 30 g 0   No facility-administered medications prior to visit.    Allergies  Allergen Reactions  . No Known Drug Allergy     ROS Review of Systems  All other systems reviewed and are negative.     Objective:    Physical Exam CONSTITUTIONAL:  Well-developed, well-nourished morbid obese female in no acute distress.  HENT:  Normocephalic, atraumatic, External right and left ear normal. EYES: Conjunctivae and EOM are normal. Pupils are equal, round, and reactive to light. No scleral icterus.  NECK: Normal range of motion, supple, no masses.  Normal thyroid.  SKIN: Skin is warm and dry. No rash noted. Not diaphoretic. No erythema. No pallor. [place]: Alert and oriented to person, place, and time. Normal reflexes, muscle tone coordination. No cranial nerve deficit noted. PSYCHIATRIC: Normal mood and affect. Normal behavior. Normal judgment and thought content. CARDIOVASCULAR: Normal heart rate noted, regular rhythm RESPIRATORY: Clear to auscultation bilaterally. Effort and breath sounds normal, no problems with respiration noted. BREASTS: mammogram schedule 06/02/20 Taught SBE ABDOMEN: Soft, normal bowel sounds, no distention noted.  No tenderness, rebound or guarding.  PELVIC: Normal appearing external genitalia; normal appearing vaginal mucosa and cervix.  No abnormal discharge noted.  Pap smear obtained.  Normal uterine size, no other palpable masses, no uterine or adnexal tenderness. MUSCULOSKELETAL: Normal range of motion. No tenderness.  No cyanosis, clubbing, or edema.  2+ distal pulses.  BP 119/77 (BP Location: Right Arm, Patient Position: Sitting, Cuff Size: Large)   Pulse 65   Temp (!) 97.2 F (36.2 C) (Temporal)   Ht 5' 7"  (1.702 m)   Wt (!) 350 lb 9.6 oz (159 kg)   LMP 05/22/2016   SpO2 100%   BMI 54.91 kg/m  Wt Readings from Last 3 Encounters:  06/01/20 (!) 350 lb 9.6 oz (159 kg)  05/15/20 (!) 351 lb 9.6 oz (159.5 kg)  01/14/19 (!) 330 lb (149.7 kg)     Health Maintenance Due  Topic Date Due  . COVID-19 Vaccine (1) Never done  . MAMMOGRAM  Never done  . COLONOSCOPY  Never done  . INFLUENZA VACCINE  05/07/2020  . PAP SMEAR-Modifier  05/30/2020    There are no preventive care reminders to display for this  patient.  Lab Results  Component Value Date   TSH 0.888 01/28/2018   Lab Results  Component Value Date   WBC 4.3 01/28/2018   HGB 12.2 01/28/2018   HCT 39.4 01/28/2018   MCV 86 01/28/2018   PLT 310.0 05/30/2017   Lab Results  Component Value Date   NA 143 12/16/2018   K 4.4 12/16/2018   CO2 21 12/16/2018   GLUCOSE 97 12/16/2018   BUN 14 12/16/2018   CREATININE 0.93 12/16/2018  BILITOT 0.3 12/16/2018   ALKPHOS 77 12/16/2018   AST 18 12/16/2018   ALT 14 12/16/2018   PROT 6.9 12/16/2018   ALBUMIN 4.1 12/16/2018   CALCIUM 8.9 12/16/2018   GFR 80.97 05/30/2017   Lab Results  Component Value Date   CHOL 174 06/04/2018   Lab Results  Component Value Date   HDL 67 06/04/2018   Lab Results  Component Value Date   LDLCALC 89 06/04/2018   Lab Results  Component Value Date   TRIG 91 06/04/2018   Lab Results  Component Value Date   CHOLHDL 3 05/30/2017   Lab Results  Component Value Date   HGBA1C 6.0 (A) 06/01/2020      Assessment & Plan:  Wendy Wagner was seen today for gynecologic exam.  Diagnoses and all orders for this visit:  Prediabetes -     Glucose (CBG) -     HgB A1c 6.0 -     Cancel: CBC with Differential Discussed low carbs diet and exercising   Low back pain, unspecified back pain laterality, unspecified chronicity, unspecified whether sciatica present -     POCT URINALYSIS DIP ([facility])  Malignant neoplasm of endocervix ([place]) -     Cytology - PAP([place])  Screening examination for STD (sexually transmitted disease) -     Cervicovaginal ancillary only  Screening for metabolic disorder -     Cancel: CMP14+EGFR  Morbid obesity ([place]) Obesity is 30-39 indicating an excess in caloric intake or underlining conditions. This may lead to other co-morbidities. Lifestyle modifications of diet and exercise may reduce obesity.  -     Cancel: Lipid Panel   No orders of the defined types were placed in this encounter.   Follow-up: No  follow-ups on file.    [name], NP

## 2020-06-01 NOTE — Patient Instructions (Signed)

## 2020-06-02 ENCOUNTER — Ambulatory Visit
Admission: RE | Admit: 2020-06-02 | Discharge: 2020-06-02 | Disposition: A | Discharge status: Home / Self Care | Payer: 59 | Source: Ambulatory Visit | Attending: Primary Care | Admitting: Primary Care

## 2020-06-02 DIAGNOSIS — Z1231 Encounter for screening mammogram for malignant neoplasm of breast: Secondary | ICD-10-CM

## 2020-06-02 LAB — CERVICOVAGINAL ANCILLARY ONLY
Bacterial Vaginitis (gardnerella): NEGATIVE
Candida Glabrata: NEGATIVE
Candida Vaginitis: NEGATIVE
Chlamydia: NEGATIVE
Comment: NEGATIVE
Comment: NEGATIVE
Comment: NEGATIVE
Comment: NEGATIVE
Comment: NEGATIVE
Comment: NORMAL
Neisseria Gonorrhea: NEGATIVE
Trichomonas: NEGATIVE

## 2020-06-05 LAB — CYTOLOGY - PAP

## 2020-07-17 ENCOUNTER — Encounter: Payer: 59 | Admitting: Gastroenterology

## 2020-08-21 ENCOUNTER — Ambulatory Visit: Payer: 59 | Admitting: Gastroenterology

## 2020-10-03 ENCOUNTER — Telehealth (INDEPENDENT_AMBULATORY_CARE_PROVIDER_SITE_OTHER): Payer: Self-pay

## 2020-10-03 NOTE — Telephone Encounter (Signed)
Patient is aware that PCP is not in office to complete forms. All forms have a period of up to 14 days to be completed. Patient also advised that Tessalon pearls have not prescribed since 2019 would need appt to address need for medication. Advised her to go to urgent care.

## 2020-10-03 NOTE — Telephone Encounter (Signed)
Patient came to the clinic dropping off a form from her insurance stating that PCP needs to fill it out before January 1st. Patient was advice PCP is not in the clinic and forms take up to 14 days. Patient wants a call back advising if PCP will fill out the form.   Please advise [phone]

## 2020-10-03 NOTE — Telephone Encounter (Signed)
Patient came into clinic requesting a refill for benzonatate (TESSALON) 100 MG capsule    Patient uses [facility] [phone] -  [place], [place] - [address]   Please advise [phone]

## 2020-10-16 ENCOUNTER — Telehealth: Payer: Self-pay

## 2020-10-16 NOTE — Telephone Encounter (Signed)
Contacted patient by phone to schedule upcoming appt with [facility] provider. Patient shared they were experiencing Covid-19 symptoms and would like to be seen. Staff informed patient of covid protocol for testing.

## 2020-10-17 ENCOUNTER — Encounter: Payer: Self-pay | Admitting: Physician Assistant

## 2020-10-17 ENCOUNTER — Telehealth (INDEPENDENT_AMBULATORY_CARE_PROVIDER_SITE_OTHER): Payer: 59 | Admitting: Physician Assistant

## 2020-10-17 DIAGNOSIS — J069 Acute upper respiratory infection, unspecified: Secondary | ICD-10-CM

## 2020-10-17 DIAGNOSIS — R059 Cough, unspecified: Secondary | ICD-10-CM | POA: Insufficient documentation

## 2020-10-17 DIAGNOSIS — Z20822 Contact with and (suspected) exposure to covid-19: Secondary | ICD-10-CM

## 2020-10-17 LAB — POCT INFLUENZA A/B
Influenza A, POC: NEGATIVE
Influenza B, POC: NEGATIVE

## 2020-10-17 LAB — POC COVID19 BINAXNOW: SARS Coronavirus 2 Ag: NEGATIVE

## 2020-10-17 MED ORDER — FLUTICASONE PROPIONATE 50 MCG/ACT NA SUSP: 2.0000 | Freq: Every day | NASAL | 6 refills | Status: AC

## 2020-10-17 MED ORDER — BENZONATATE 100 MG PO CAPS: 100.0000 mg | ORAL_CAPSULE | Freq: Two times a day (BID) | ORAL | 0 refills | Status: DC | PRN

## 2020-10-17 NOTE — Progress Notes (Signed)
Established Patient Office Visit  Subjective:  Patient ID: Wendy Wagner, female    DOB: 06/13/1966  Age: 55 y.o. MRN: [id number]  CC:  Chief Complaint  Patient presents with  . URI   Virtual Visit via Telephone Note  I connected with Wendy Wagner on 10/17/20 at 10:10 AM EST by telephone and verified that I am speaking with the correct person using two identifiers.  Location: Patient: car Provider: [facility]    I discussed the limitations, risks, security and privacy concerns of performing an evaluation and management service by telephone and the availability of in person appointments. I also discussed with the patient that there may be a patient responsible charge related to this service. The patient expressed understanding and agreed to proceed.   History of Present Illness:  Wendy Wagner reports that she has been having a dry cough for the past 2 weeks, states that she did have some body aches about 3 weeks ago, but does endorse that only lasted 1 day.  Reports that she has felt "stuffy" in her head and both ears.  Reports that she has been taking Mucinex as needed with relief.  Reports that she also used [employer] which did offer relief.  States that she had a previous prescription but did run out.  States that she does have a history of asthma, denies any shortness of breath or wheezing.  Denies any sick contacts.  Denies any previous COVID vaccines, no recent flu shot.    Observations/Objective: Medical history and current medications reviewed, no physical exam completed      Past Medical History:  Diagnosis Date  . Anemia   . Back pain   . Childhood asthma   . GERD (gastroesophageal reflux disease)   . Heart murmur   . Leg edema     Past Surgical History:  Procedure Laterality Date  . CESAREAN SECTION    . WISDOM TOOTH EXTRACTION      Family History  Problem Relation Age of Onset  . Cancer Mother        brain  .  Cancer Father        brain, lung  . Alcoholism Father   . Diabetes Neg Hx   . Heart disease Neg Hx   . Stroke Neg Hx     Social History   Socioeconomic History  . Marital status: Single    Spouse name: Not on file  . Number of children: Not on file  . Years of education: Not on file  . Highest education level: Not on file  Occupational History  . Occupation: Cook  Tobacco Use  . Smoking status: Former Smoker    Packs/day: 1.00    Years: 8.00    Pack years: 8.00    Types: Cigarettes  . Smokeless tobacco: Never Used  Substance and Sexual Activity  . Alcohol use: No    Alcohol/week: 0.0 standard drinks  . Drug use: No  . Sexual activity: Not Currently  Other Topics Concern  . Not on file  Social History Narrative  . Not on file   Social Determinants of Health   Financial Resource Strain: Not on file  Food Insecurity: Not on file  Transportation Needs: Not on file  Physical Activity: Not on file  Stress: Not on file  Social Connections: Not on file  Intimate Partner Violence: Not on file    Outpatient Medications Prior to Visit  Medication Sig Dispense Refill  . ibuprofen (ADVIL,MOTRIN)  800 MG tablet Take 1 tablet (800 mg total) by mouth every 8 (eight) hours as needed. 30 tablet 2  . metFORMIN (GLUCOPHAGE) 500 MG tablet Take 1 tablet (500 mg total) by mouth daily with breakfast. 30 tablet 1  . naproxen (NAPROSYN) 500 MG tablet Take 1 tablet (500 mg total) by mouth 2 (two) times daily. 30 tablet 0  . traMADol (ULTRAM) 50 MG tablet Take 1 tablet (50 mg total) by mouth every 8 (eight) hours as needed. 50 tablet 0  . Vitamin D, Ergocalciferol, (DRISDOL) 1.25 MG (50000 UT) CAPS capsule Take 1 capsule (50,000 Units total) by mouth every 7 (seven) days. 4 capsule 1  . metFORMIN (GLUCOPHAGE-XR) 500 MG 24 hr tablet SMARTSIG:1 Tablet(s) By Mouth Every Evening    . orphenadrine (NORFLEX) 100 MG tablet Take 1 tablet (100 mg total) by mouth 2 (two) times daily. (Patient not  taking: No sig reported) 20 tablet 0  . triamcinolone cream (KENALOG) 0.1 % Apply 1 application topically 2 (two) times daily. (Patient not taking: No sig reported) 30 g 0   No facility-administered medications prior to visit.    Allergies  Allergen Reactions  . No Known Drug Allergy     ROS Review of Systems  Constitutional: Negative for chills, fatigue and fever.  HENT: Positive for congestion. Negative for ear discharge, ear pain, sinus pressure, sinus pain, sore throat and trouble swallowing.   Eyes: Negative.   Respiratory: Positive for cough. Negative for shortness of breath and wheezing.   Cardiovascular: Negative.   Gastrointestinal: Negative for abdominal pain, diarrhea, nausea and vomiting.  Endocrine: Negative.   Genitourinary: Negative.   Musculoskeletal: Negative for myalgias.  Allergic/Immunologic: Negative.   Neurological: Negative for headaches.  Hematological: Negative.   Psychiatric/Behavioral: Negative.       Objective:     LMP 05/22/2016  Wt Readings from Last 3 Encounters:  06/01/20 (!) 350 lb 9.6 oz (159 kg)  05/15/20 (!) 351 lb 9.6 oz (159.5 kg)  01/14/19 (!) 330 lb (149.7 kg)     Health Maintenance Due  Topic Date Due  . COLONOSCOPY (Pts 45-56yrs Insurance coverage will need to be confirmed)  Never done    There are no preventive care reminders to display for this patient.  Lab Results  Component Value Date   TSH 0.888 01/28/2018   Lab Results  Component Value Date   WBC 4.3 01/28/2018   HGB 12.2 01/28/2018   HCT 39.4 01/28/2018   MCV 86 01/28/2018   PLT 310.0 05/30/2017   Lab Results  Component Value Date   NA 143 12/16/2018   K 4.4 12/16/2018   CO2 21 12/16/2018   GLUCOSE 97 12/16/2018   BUN 14 12/16/2018   CREATININE 0.93 12/16/2018   BILITOT 0.3 12/16/2018   ALKPHOS 77 12/16/2018   AST 18 12/16/2018   ALT 14 12/16/2018   PROT 6.9 12/16/2018   ALBUMIN 4.1 12/16/2018   CALCIUM 8.9 12/16/2018   GFR 80.97 05/30/2017    Lab Results  Component Value Date   CHOL 174 06/04/2018   Lab Results  Component Value Date   HDL 67 06/04/2018   Lab Results  Component Value Date   LDLCALC 89 06/04/2018   Lab Results  Component Value Date   TRIG 91 06/04/2018   Lab Results  Component Value Date   CHOLHDL 3 05/30/2017   Lab Results  Component Value Date   HGBA1C 6.0 (A) 06/01/2020      Assessment & Plan:  Problem List Items Addressed This Visit   None   Visit Diagnoses    Cough    -  Primary   Relevant Medications   benzonatate (TESSALON) 100 MG capsule   Other Relevant Orders   POC COVID-19 (Completed)   Influenza A/B (Completed)   Acute upper respiratory infection       Relevant Medications   fluticasone (FLONASE) 50 MCG/ACT nasal spray      Meds ordered this encounter  Medications  . fluticasone (FLONASE) 50 MCG/ACT nasal spray    Sig: Place 2 sprays into both nostrils daily.    Dispense:  16 g    Refill:  6    Order Specific Question:   Supervising Provider    Answer:   Asencion Noble E [[id number]]  . benzonatate (TESSALON) 100 MG capsule    Sig: Take 1 capsule (100 mg total) by mouth 2 (two) times daily as needed for cough.    Dispense:  20 capsule    Refill:  0    Order Specific Question:   Supervising Provider    Answer:   Elsie Stain [[id number]]   Assessment and Plan: 1. Cough Flu test negative, rapid COVID test negative.  Continue supportive care as needed, patient education given on rest, hydration, resume Tessalon Perles as needed.  Trial Flonase.  Red flag warnings given for prompt reevaluation.  Patient education given on importance and efficacy of COVID-vaccine.  - POC COVID-19 - Influenza A/B - benzonatate (TESSALON) 100 MG capsule; Take 1 capsule (100 mg total) by mouth 2 (two) times daily as needed for cough.  Dispense: 20 capsule; Refill: 0  2. Acute upper respiratory infection  - fluticasone (FLONASE) 50 MCG/ACT nasal spray; Place 2 sprays into both  nostrils daily.  Dispense: 16 g; Refill: 6  Follow Up Instructions:    I discussed the assessment and treatment plan with the patient. The patient was provided an opportunity to ask questions and all were answered. The patient agreed with the plan and demonstrated an understanding of the instructions.   The patient was advised to call back or seek an in-person evaluation if the symptoms worsen or if the condition fails to improve as anticipated.  I provided 21 minutes of non-face-to-face time during this encounter.     Follow-up: Return if symptoms worsen or fail to improve.    Loraine Grip Adilene Areola, PA-C

## 2020-10-17 NOTE — Patient Instructions (Signed)
Your flu test was negative, your rapid COVID test was negative.  I have sent Tessalon Perles to your pharmacy to help you with the cough, and Flonase to help you with the stuffiness.  I encourage you to continue stay well-hydrated, and get plenty of rest.  I strongly encourage you to become vaccinated against COVID-19.  I have enclosed information on how to find vaccination locations.  I hope that you feel better soon, please let us know if there is anything else we can do for you.   Kennieth Rad, PA-C Physician Assistant [facility] Medicine [email or web address]   COVID-19 Vaccine Information can be found at: [email or web address].uk For questions related to vaccine distribution or appointments, please email [email or web address] or call [phone].    Cough, Adult Coughing is a reflex that clears your throat and your airways (respiratory system). Coughing helps to heal and protect your lungs. It is normal to cough occasionally, but a cough that happens with other symptoms or lasts a long time may be a sign of a condition that needs treatment. An acute cough may only last 2-3 weeks, while a chronic cough may last 8 or more weeks. Coughing is commonly caused by:  Infection of the respiratory systemby viruses or bacteria.  Breathing in substances that irritate your lungs.  Allergies.  Asthma.  Mucus that runs down the back of your throat (postnasal drip).  Smoking.  Acid backing up from the stomach into the esophagus (gastroesophageal reflux).  Certain medicines.  Chronic lung problems.  Other medical conditions such as heart failure or a blood clot in the lung (pulmonary embolism). Follow these instructions at home: Medicines  Take over-the-counter and prescription medicines only as told by your health care provider.  Talk with your health care provider before you take  a cough suppressant medicine. Lifestyle  Avoid cigarette smoke. Do not use any products that contain nicotine or tobacco, such as cigarettes, e-cigarettes, and chewing tobacco. If you need help quitting, ask your health care provider.  Drink enough fluid to keep your urine pale yellow.  Avoid caffeine.  Do not drink alcohol if your health care provider tells you not to drink.   General instructions  Pay close attention to changes in your cough. Tell your health care provider about them.  Always cover your mouth when you cough.  Avoid things that make you cough, such as perfume, candles, cleaning products, or campfire or tobacco smoke.  If the air is dry, use a cool mist vaporizer or humidifier in your bedroom or your home to help loosen secretions.  If your cough is worse at night, try to sleep in a semi-upright position.  Rest as needed.  Keep all follow-up visits as told by your health care provider. This is important.   Contact a health care provider if you:  Have new symptoms.  Cough up pus.  Have a cough that does not get better after 2-3 weeks or gets worse.  Cannot control your cough with cough suppressant medicines and you are losing sleep.  Have pain that gets worse or pain that is not helped with medicine.  Have a fever.  Have unexplained weight loss.  Have night sweats. Get help right away if:  You cough up blood.  You have difficulty breathing.  Your heartbeat is very fast. These symptoms may represent a serious problem that is an emergency. Do not wait to see if the symptoms will go away. Get medical help right away. Call your local emergency services (  911 in the [place] Do not drive yourself to the hospital. Summary  Coughing is a reflex that clears your throat and your airways. It is normal to cough occasionally, but a cough that happens with other symptoms or lasts a long time may be a sign of a condition that needs treatment.  Take  over-the-counter and prescription medicines only as told by your health care provider.  Always cover your mouth when you cough.  Contact a health care provider if you have new symptoms or a cough that does not get better after 2-3 weeks or gets worse. This information is not intended to replace advice given to you by your health care provider. Make sure you discuss any questions you have with your health care provider. Document Revised: 10/12/2018 Document Reviewed: 10/12/2018 Elsevier Patient Education  [place].

## 2020-10-17 NOTE — Progress Notes (Signed)
Patient verified DOB Patient complains of symptoms beginning 2.5 weeks ago. Patient complains of dry cough. Patient denies N/V/ diarrhea. Patient denies fevers at home. Patients taste and smell is present. Patient is able to eat and drink normally. Patient denies HA/ body aches. Patient denies sneezing or blowing her nose. Patient complains of head and ear pressure. Patient did not use medication today. Patient has used OTC medications with minimal relief. Patient denies any exposure.

## 2020-11-06 ENCOUNTER — Telehealth (INDEPENDENT_AMBULATORY_CARE_PROVIDER_SITE_OTHER): Payer: 59 | Admitting: Primary Care

## 2020-11-06 DIAGNOSIS — R059 Cough, unspecified: Secondary | ICD-10-CM | POA: Diagnosis not present

## 2020-11-06 NOTE — Progress Notes (Signed)
Virtual Visit   I connected with [name] on 11/06/20 at 11:10 AM EST by telephone and verified that I am speaking with the correct person using two identifiers.  Location: Patient: home  Provider: [name] @[facility]     I discussed the limitations, risks, security and privacy concerns of performing an evaluation and management service by telephone and the availability of in person appointments. I also discussed with the patient that there may be a patient responsible charge related to this service. The patient expressed understanding and agreed to proceed.   History of Present Illness: Wendy Wagner is a 55 year old female having a virtual visit for cold cough flu like symptoms. Denies fever, chills shortness of breath. Due to this new variant suggested to be tested for COVID and treat symptoms only. Call to inform if results are positive. Past Medical History:  Diagnosis Date  . Anemia   . Back pain   . Childhood asthma   . GERD (gastroesophageal reflux disease)   . Heart murmur   . Leg edema      Current Outpatient Medications on File Prior to Visit  Medication Sig Dispense Refill  . benzonatate (TESSALON) 100 MG capsule Take 1 capsule (100 mg total) by mouth 2 (two) times daily as needed for cough. 20 capsule 0  . fluticasone (FLONASE) 50 MCG/ACT nasal spray Place 2 sprays into both nostrils daily. 16 g 6  . ibuprofen (ADVIL,MOTRIN) 800 MG tablet Take 1 tablet (800 mg total) by mouth every 8 (eight) hours as needed. 30 tablet 2  . metFORMIN (GLUCOPHAGE) 500 MG tablet Take 1 tablet (500 mg total) by mouth daily with breakfast. 30 tablet 1  . metFORMIN (GLUCOPHAGE-XR) 500 MG 24 hr tablet SMARTSIG:1 Tablet(s) By Mouth Every Evening    . naproxen (NAPROSYN) 500 MG tablet Take 1 tablet (500 mg total) by mouth 2 (two) times daily. 30 tablet 0  . orphenadrine (NORFLEX) 100 MG tablet Take 1 tablet (100 mg total) by mouth 2 (two) times daily. (Patient not taking: No sig  reported) 20 tablet 0  . traMADol (ULTRAM) 50 MG tablet Take 1 tablet (50 mg total) by mouth every 8 (eight) hours as needed. 50 tablet 0  . triamcinolone cream (KENALOG) 0.1 % Apply 1 application topically 2 (two) times daily. (Patient not taking: No sig reported) 30 g 0  . Vitamin D, Ergocalciferol, (DRISDOL) 1.25 MG (50000 UT) CAPS capsule Take 1 capsule (50,000 Units total) by mouth every 7 (seven) days. 4 capsule 1   No current facility-administered medications on file prior to visit.   Observations/Objective: Pertinent positive and negative noted in HPI  Assessment and Plan: Diagnoses and all orders for this visit:  Cough  cough with upper airway cough syndrome (previously termed postnasal drip syndrome) recently tx for cough . R/O COVID explain symptoms can be mild   Follow Up Instructions:    I discussed the assessment and treatment plan with the patient. The patient was provided an opportunity to ask questions and all were answered. The patient agreed with the plan and demonstrated an understanding of the instructions.   The patient was advised to call back or seek an in-person evaluation if the symptoms worsen or if the condition fails to improve as anticipated.  I provided  10 minutes of non-face-to-face time during this encounter.   [name], NP  Assessment and Plan:    HPI  Past Medical History:  Diagnosis Date  . Anemia   .  Back pain   . Childhood asthma   . GERD (gastroesophageal reflux disease)   . Heart murmur   . Leg edema      Allergies  Allergen Reactions  . No Known Drug Allergy       Current Outpatient Medications on File Prior to Visit  Medication Sig Dispense Refill  . benzonatate (TESSALON) 100 MG capsule Take 1 capsule (100 mg total) by mouth 2 (two) times daily as needed for cough. 20 capsule 0  . fluticasone (FLONASE) 50 MCG/ACT nasal spray Place 2 sprays into both nostrils daily. 16 g 6  . ibuprofen (ADVIL,MOTRIN) 800 MG  tablet Take 1 tablet (800 mg total) by mouth every 8 (eight) hours as needed. 30 tablet 2  . metFORMIN (GLUCOPHAGE) 500 MG tablet Take 1 tablet (500 mg total) by mouth daily with breakfast. 30 tablet 1  . metFORMIN (GLUCOPHAGE-XR) 500 MG 24 hr tablet SMARTSIG:1 Tablet(s) By Mouth Every Evening    . naproxen (NAPROSYN) 500 MG tablet Take 1 tablet (500 mg total) by mouth 2 (two) times daily. 30 tablet 0  . orphenadrine (NORFLEX) 100 MG tablet Take 1 tablet (100 mg total) by mouth 2 (two) times daily. (Patient not taking: No sig reported) 20 tablet 0  . traMADol (ULTRAM) 50 MG tablet Take 1 tablet (50 mg total) by mouth every 8 (eight) hours as needed. 50 tablet 0  . triamcinolone cream (KENALOG) 0.1 % Apply 1 application topically 2 (two) times daily. (Patient not taking: No sig reported) 30 g 0  . Vitamin D, Ergocalciferol, (DRISDOL) 1.25 MG (50000 UT) CAPS capsule Take 1 capsule (50,000 Units total) by mouth every 7 (seven) days. 4 capsule 1   No current facility-administered medications on file prior to visit.    ROS: all negative except above.   Physical Exam: There were no vitals filed for this visit. LMP 05/22/2016  General Appearance: Well nourished, in no apparent distress. Eyes: PERRLA, EOMs, conjunctiva no swelling or erythema Sinuses: No Frontal/maxillary tenderness ENT/Mouth: Ext aud canals clear, TMs without erythema, bulging. No erythema, swelling, or exudate on post pharynx.  Tonsils not swollen or erythematous. Hearing normal.  Neck: Supple, thyroid normal.  Respiratory: Respiratory effort normal, BS equal bilaterally without rales, rhonchi, wheezing or stridor.  Cardio: RRR with no MRGs. Brisk peripheral pulses without edema.  Abdomen: Soft, + BS.  Non tender, no guarding, rebound, hernias, masses. Lymphatics: Non tender without lymphadenopathy.  Musculoskeletal: Full ROM, 5/5 strength, normal gait.  Skin: Warm, dry without rashes, lesions, ecchymosis.  Neuro: Cranial  nerves intact. Normal muscle tone, no cerebellar symptoms. Sensation intact.  Psych: Awake and oriented X 3, normal affect, Insight and Judgment appropriate.     [name], NP 11:16 AM [facility]

## 2020-12-04 ENCOUNTER — Ambulatory Visit (INDEPENDENT_AMBULATORY_CARE_PROVIDER_SITE_OTHER): Payer: 59 | Admitting: Primary Care

## 2021-06-28 ENCOUNTER — Ambulatory Visit (INDEPENDENT_AMBULATORY_CARE_PROVIDER_SITE_OTHER): Payer: 59 | Admitting: Primary Care

## 2021-06-28 ENCOUNTER — Encounter (INDEPENDENT_AMBULATORY_CARE_PROVIDER_SITE_OTHER): Payer: Self-pay | Admitting: Primary Care

## 2021-06-28 ENCOUNTER — Other Ambulatory Visit: Payer: Self-pay

## 2021-06-28 VITALS — BP 122/77 | HR 77 | Temp 97.5°F | Ht 67.0 in | Wt 352.2 lb

## 2021-06-28 DIAGNOSIS — F3289 Other specified depressive episodes: Secondary | ICD-10-CM

## 2021-06-28 DIAGNOSIS — R7303 Prediabetes: Secondary | ICD-10-CM

## 2021-06-28 DIAGNOSIS — F17210 Nicotine dependence, cigarettes, uncomplicated: Secondary | ICD-10-CM | POA: Diagnosis not present

## 2021-06-28 DIAGNOSIS — Z720 Tobacco use: Secondary | ICD-10-CM

## 2021-06-28 DIAGNOSIS — Z6841 Body Mass Index (BMI) 40.0 and over, adult: Secondary | ICD-10-CM

## 2021-06-28 LAB — POCT GLYCOSYLATED HEMOGLOBIN (HGB A1C): Hemoglobin A1C: 6.2 % — AB (ref 4.0–5.6)

## 2021-06-28 MED ORDER — METFORMIN HCL ER 500 MG PO TB24: ORAL_TABLET | ORAL | 1 refills | Status: AC

## 2021-06-28 NOTE — Progress Notes (Signed)
Established Patient Office Visit  Subjective:  Patient ID: Wendy Wagner, female    DOB: 16-Sep-1966  Age: 55 y.o. MRN: [id number]  CC:  Chief Complaint  Patient presents with   body pain    Injured rotators cuffs in an accident previously     HPI Wendy Wagner presents is a  55 year old severe morbid obese female in with complaints of body pain all over especially her back. She  hx of lumbar degenerative disc disease at L4/5 and L5/S1. Continues with low back and intermittent left leg pain and right shoulder pain.Followed by orthopedics . Pain is aching. Left side of low back is worse than right side. Rates pain 7/10. Pain worsened with prolonged standing, sitting, and walking. Pain decrease when  laying down. Past Medical History:  Diagnosis Date   Anemia    Back pain    Childhood asthma    GERD (gastroesophageal reflux disease)    Heart murmur    Leg edema     Past Surgical History:  Procedure Laterality Date   CESAREAN SECTION     WISDOM TOOTH EXTRACTION      Family History  Problem Relation Age of Onset   Cancer Mother        brain   Cancer Father        brain, lung   Alcoholism Father    Diabetes Neg Hx    Heart disease Neg Hx    Stroke Neg Hx     Social History   Socioeconomic History   Marital status: Single    Spouse name: Not on file   Number of children: Not on file   Years of education: Not on file   Highest education level: Not on file  Occupational History   Occupation: Cook  Tobacco Use   Smoking status: Former    Packs/day: 1.00    Years: 8.00    Pack years: 8.00    Types: Cigarettes   Smokeless tobacco: Never  Substance and Sexual Activity   Alcohol use: No    Alcohol/week: 0.0 standard drinks   Drug use: No   Sexual activity: Not Currently  Other Topics Concern   Not on file  Social History Narrative   Not on file   Social Determinants of Health   Financial Resource Strain: Not on file  Food Insecurity: Not on file   Transportation Needs: Not on file  Physical Activity: Not on file  Stress: Not on file  Social Connections: Not on file  Intimate Partner Violence: Not on file    Outpatient Medications Prior to Visit  Medication Sig Dispense Refill   traMADol (ULTRAM) 50 MG tablet Take 1 tablet by mouth every 6 (six) hours as needed.     Vitamin D, Ergocalciferol, (DRISDOL) 1.25 MG (50000 UT) CAPS capsule Take 1 capsule (50,000 Units total) by mouth every 7 (seven) days. 4 capsule 1   metFORMIN (GLUCOPHAGE-XR) 500 MG 24 hr tablet SMARTSIG:1 Tablet(s) By Mouth Every Evening     fluticasone (FLONASE) 50 MCG/ACT nasal spray Place 2 sprays into both nostrils daily. 16 g 6   ibuprofen (ADVIL,MOTRIN) 800 MG tablet Take 1 tablet (800 mg total) by mouth every 8 (eight) hours as needed. 30 tablet 2   benzonatate (TESSALON) 100 MG capsule Take 1 capsule (100 mg total) by mouth 2 (two) times daily as needed for cough. 20 capsule 0   metFORMIN (GLUCOPHAGE) 500 MG tablet Take 1 tablet (500 mg total) by mouth daily with  breakfast. 30 tablet 1   naproxen (NAPROSYN) 500 MG tablet Take 1 tablet (500 mg total) by mouth 2 (two) times daily. 30 tablet 0   orphenadrine (NORFLEX) 100 MG tablet Take 1 tablet (100 mg total) by mouth 2 (two) times daily. (Patient not taking: No sig reported) 20 tablet 0   traMADol (ULTRAM) 50 MG tablet Take 1 tablet (50 mg total) by mouth every 8 (eight) hours as needed. 50 tablet 0   triamcinolone cream (KENALOG) 0.1 % Apply 1 application topically 2 (two) times daily. (Patient not taking: No sig reported) 30 g 0   No facility-administered medications prior to visit.    Allergies  Allergen Reactions   No Known Drug Allergy     ROS Review of Systems  HENT:  Negative for facial swelling.   Musculoskeletal:  Positive for arthralgias.  All other systems reviewed and are negative.    Objective:    Physical Exam Vitals reviewed.  Constitutional:      Comments: Severe morbid obesity    HENT:     Right Ear: Tympanic membrane and external ear normal.     Left Ear: Tympanic membrane and external ear normal.     Nose: Nose normal.  Eyes:     Extraocular Movements: Extraocular movements intact.     Pupils: Pupils are equal, round, and reactive to light.  Cardiovascular:     Rate and Rhythm: Normal rate and regular rhythm.  Pulmonary:     Effort: Pulmonary effort is normal.     Breath sounds: Normal breath sounds.  Abdominal:     General: Bowel sounds are normal. There is distension.     Palpations: Abdomen is soft.  Musculoskeletal:        General: Normal range of motion.     Cervical back: Normal range of motion and neck supple.  Skin:    General: Skin is warm and dry.  Neurological:     Mental Status: She is alert and oriented to person, place, and time.  Psychiatric:        Mood and Affect: Mood normal.        Behavior: Behavior normal.        Thought Content: Thought content normal.        Judgment: Judgment normal.   BP 122/77 (BP Location: Right Arm, Patient Position: Sitting, Cuff Size: Large)   Pulse 77   Temp (!) 97.5 F (36.4 C) (Temporal)   Ht 5\' 7"  (1.702 m)   Wt (!) 352 lb 3.2 oz (159.8 kg)   LMP 05/22/2016   SpO2 93%   BMI 55.16 kg/m  Wt Readings from Last 3 Encounters:  06/28/21 (!) 352 lb 3.2 oz (159.8 kg)  06/01/20 (!) 350 lb 9.6 oz (159 kg)  05/15/20 (!) 351 lb 9.6 oz (159.5 kg)     Health Maintenance Due  Topic Date Due   COVID-19 Vaccine (1) Never done   Zoster Vaccines- Shingrix (1 of 2) Never done   COLONOSCOPY (Pts 45-69yrs Insurance coverage will need to be confirmed)  Never done    There are no preventive care reminders to display for this patient.  Lab Results  Component Value Date   TSH 0.888 01/28/2018   Lab Results  Component Value Date   WBC 4.3 01/28/2018   HGB 12.2 01/28/2018   HCT 39.4 01/28/2018   MCV 86 01/28/2018   PLT 310.0 05/30/2017   Lab Results  Component Value Date   NA 143 12/16/2018  K  4.4 12/16/2018   CO2 21 12/16/2018   GLUCOSE 97 12/16/2018   BUN 14 12/16/2018   CREATININE 0.93 12/16/2018   BILITOT 0.3 12/16/2018   ALKPHOS 77 12/16/2018   AST 18 12/16/2018   ALT 14 12/16/2018   PROT 6.9 12/16/2018   ALBUMIN 4.1 12/16/2018   CALCIUM 8.9 12/16/2018   GFR 80.97 05/30/2017   Lab Results  Component Value Date   CHOL 174 06/04/2018   Lab Results  Component Value Date   HDL 67 06/04/2018   Lab Results  Component Value Date   LDLCALC 89 06/04/2018   Lab Results  Component Value Date   TRIG 91 06/04/2018   Lab Results  Component Value Date   CHOLHDL 3 05/30/2017   Lab Results  Component Value Date   HGBA1C 6.2 (A) 06/28/2021      Assessment & Plan:  Brandon was seen today for body pain.  Diagnoses and all orders for this visit:  Prediabetes -     HgB A1c 6.2  Refilled metformin 500mg  XR daily. Continue to monitor foods that are high in carbohydrates are the following rice, potatoes, breads, sugars, and pastas.  Reduction in the intake (eating) will assist in lowering your blood sugars.   Morbid (severe) obesity due to excess calories (HCC) Morbid Obesity ( severe)BMI > 40  indicating an excess in caloric intake or underlining conditions. This may lead to other co-morbidities. Lifestyle modifications of diet and exercise may reduce obesity.   -     Amb ref to Medical Nutrition Therapy-MNT  Other depression [place] Office Visit from 06/28/2021 in [place] [facility]  PHQ-9 Total Score 15      Followed by a clinician every 2 weeks  Tobacco abuse - I have recommended complete cessation of tobacco use. I have discussed various options available for assistance with tobacco cessation including over the counter methods (Nicotine gum, patch and lozenges). We also discussed prescription options (Chantix, Nicotine Inhaler / Nasal Spray). The patient is not interested in pursuing any prescription tobacco cessation options at this  time. - Patient declines at this time.  - Less than 10 minutes spent on counseling.   Other orders -     metFORMIN (GLUCOPHAGE-XR) 500 MG 24 hr tablet; SMARTSIG:1 Tablet(s) By Mouth Every Evening     Follow-up: Return in about 3 months (around 09/27/2021) for fasting labs and f/u on counseling .    [name], NP

## 2021-06-28 NOTE — Progress Notes (Signed)
Is followed by ortho

## 2021-06-28 NOTE — Patient Instructions (Signed)

## 2021-08-20 ENCOUNTER — Encounter: Payer: 59 | Admitting: Dietician

## 2021-09-13 ENCOUNTER — Ambulatory Visit: Payer: 59 | Admitting: Dietician

## 2021-09-27 ENCOUNTER — Other Ambulatory Visit: Payer: Self-pay

## 2021-09-27 ENCOUNTER — Ambulatory Visit (INDEPENDENT_AMBULATORY_CARE_PROVIDER_SITE_OTHER): Payer: 59 | Admitting: Primary Care

## 2021-09-27 ENCOUNTER — Encounter (INDEPENDENT_AMBULATORY_CARE_PROVIDER_SITE_OTHER): Payer: Self-pay | Admitting: Primary Care

## 2021-09-27 VITALS — BP 130/84 | HR 70 | Temp 97.9°F | Ht 67.0 in | Wt 351.6 lb

## 2021-09-27 DIAGNOSIS — F4323 Adjustment disorder with mixed anxiety and depressed mood: Secondary | ICD-10-CM

## 2021-09-27 NOTE — Progress Notes (Signed)
° °  [facility]   Subjective:     Wendy Wagner is a 55 y.o. female who presents for follow up of depression. Current symptoms include depressed mood, difficulty concentrating, fatigue, and insomnia. Symptoms have been unchanged since that time. Patient denies recurrent thoughts of death, suicidal attempt, suicidal thoughts with specific plan, and suicidal thoughts without plan. Previous treatment includes: followed by therapist . She complains of the following side effects from the treatment: none.  The following portions of the patient's history were reviewed and updated as appropriate: allergies, current medications, past family history, past medical history, past social history, and past surgical history.  Review of Systems Pertinent items noted in HPI and remainder of comprehensive ROS otherwise negative.    Objective:    BP 130/84 (BP Location: Right Arm, Patient Position: Sitting, Cuff Size: Large)    Pulse 70    Temp 97.9 F (36.6 C) (Temporal)    Ht 5\' 7"  (1.702 m)    Wt (!) 351 lb 9.6 oz (159.5 kg)    LMP 05/22/2016    SpO2 94%    BMI 55.07 kg/m   General:  alert, cooperative, appears stated age, mild distress, and morbidly obese  Affect & Behavior:  full facial expressions, good grooming, and normal perception  none    General: Vital signs reviewed.  Patient is well-developed and well-nourished, severe morbid obesity in no acute distress and cooperative with exam. Head: Normocephalic and atraumatic. Eyes: EOMI, conjunctivae normal, no scleral icterus. Neck: Supple, trachea midline, normal ROM, no JVD, masses, thyromegaly, or carotid bruit present. Cardiovascular: RRR, S1 normal, S2 normal, no murmurs, gallops, or rubs. Pulmonary/Chest: Clear to auscultation bilaterally, no wheezes, rales, or rhonchi. Abdominal: Soft, non-tender, non-distended, BS +, no masses, organomegaly, or guarding present. Musculoskeletal: No joint deformities, erythema, or stiffness, ROM  full and nontender. Extremities: No lower extremity edema bilaterally,  pulses symmetric and intact bilaterally. No cyanosis or clubbing. Neurological: A&O x3, Strength is normal Skin: Warm, dry and intact. No rashes or erythema. Psychiatric: Normal mood and affect. speech and behavior is normal. Cognition and memory are normal.    Assessment:  [name] was seen today for follow-up.  Diagnoses and all orders for this visit:  Adjustment disorder with mixed anxiety and depressed mood  Depression, gradually worsening     Continue therapy and discuss medication options.   Instructed patient to contact office or on-call physician promptly should condition worsen or any new symptoms appear and provided on-call telephone numbers. IF THE PATIENT HAS ANY SUICIDAL OR HOMICIDAL IDEATIONS, CALL THE OFFICE, DISCUSS WITH A SUPPORT MEMBER, OR GO TO THE ER IMMEDIATELY. Patient was agreeable with this plan. Spent 20 minutes (>50% of visit) discussing the risks of anxiety disorder, post traumatic stress  disorder, and sleep disturbance, the  pathophysiology, etiology, risks, and principles of treatment.   [name]

## 2021-09-27 NOTE — Patient Instructions (Signed)

## 2021-10-04 ENCOUNTER — Ambulatory Visit (INDEPENDENT_AMBULATORY_CARE_PROVIDER_SITE_OTHER): Payer: 59 | Admitting: Primary Care

## 2021-10-04 ENCOUNTER — Telehealth (INDEPENDENT_AMBULATORY_CARE_PROVIDER_SITE_OTHER): Payer: Self-pay

## 2021-10-04 ENCOUNTER — Other Ambulatory Visit: Payer: Self-pay

## 2021-10-04 ENCOUNTER — Encounter (INDEPENDENT_AMBULATORY_CARE_PROVIDER_SITE_OTHER): Payer: Self-pay | Admitting: Primary Care

## 2021-10-04 DIAGNOSIS — F4323 Adjustment disorder with mixed anxiety and depressed mood: Secondary | ICD-10-CM

## 2021-10-04 MED ORDER — ESCITALOPRAM OXALATE 20 MG PO TABS: 10.0000 mg | ORAL_TABLET | Freq: Every day | ORAL | 1 refills | Status: DC

## 2021-10-04 MED ORDER — ESCITALOPRAM OXALATE 10 MG PO TABS: 10.0000 mg | ORAL_TABLET | Freq: Every day | ORAL | 0 refills | Status: DC

## 2021-10-04 NOTE — Patient Instructions (Signed)
Escitalopram Tablets What is this medication? ESCITALOPRAM (es sye TAL oh pram) treats depression and anxiety. It increases the amount of serotonin in the brain, a hormone that helps regulate mood. It belongs to a group of medications called SSRIs. This medicine may be used for other purposes; ask your health care provider or pharmacist if you have questions. COMMON BRAND NAME(S): Lexapro What should I tell my care team before I take this medication? They need to know if you have any of these conditions: Bipolar disorder or a family history of bipolar disorder Diabetes Glaucoma Heart disease Kidney or liver disease Receiving electroconvulsive therapy Seizures Suicidal thoughts, plans, or attempt by you or a family member An unusual or allergic reaction to escitalopram, the related medication citalopram, other medications, foods, dyes, or preservatives Pregnant or trying to become pregnant Breast-feeding How should I use this medication? Take this medication by mouth with a glass of water. Follow the directions on the prescription label. You can take it with or without food. If it upsets your stomach, take it with food. Take your medication at regular intervals. Do not take it more often than directed. Do not stop taking this medication suddenly except upon the advice of your care team. Stopping this medication too quickly may cause serious side effects or your condition may worsen. A special MedGuide will be given to you by the pharmacist with each prescription and refill. Be sure to read this information carefully each time. Talk to your care team regarding the use of this medication in children. Special care may be needed. Overdosage: If you think you have taken too much of this medicine contact a poison control center or emergency room at once. NOTE: This medicine is only for you. Do not share this medicine with others. What if I miss a dose? If you miss a dose, take it as soon as you  can. If it is almost time for your next dose, take only that dose. Do not take double or extra doses. What may interact with this medication? Do not take this medication with any of the following: Certain medications for fungal infections like fluconazole, itraconazole, ketoconazole, posaconazole, voriconazole Cisapride Citalopram Dronedarone Linezolid MAOIs like Carbex, Eldepryl, Marplan, Nardil, and Parnate Methylene blue (injected into a vein) Pimozide Thioridazine This medication may also interact with the following: Alcohol Amphetamines Aspirin and aspirin-like medications Carbamazepine Certain medications for depression, anxiety, or psychotic disturbances Certain medications for migraine headache like almotriptan, eletriptan, frovatriptan, naratriptan, rizatriptan, sumatriptan, zolmitriptan Certain medications for sleep Certain medications that treat or prevent blood clots like warfarin, enoxaparin, dalteparin Cimetidine Diuretics Dofetilide Fentanyl Furazolidone Isoniazid Lithium Metoprolol NSAIDs, medications for pain and inflammation, like ibuprofen or naproxen Other medications that prolong the QT interval (cause an abnormal heart rhythm) Procarbazine Rasagiline Supplements like St. John's wort, kava kava, valerian Tramadol Tryptophan Ziprasidone This list may not describe all possible interactions. Give your health care provider a list of all the medicines, herbs, non-prescription drugs, or dietary supplements you use. Also tell them if you smoke, drink alcohol, or use illegal drugs. Some items may interact with your medicine. What should I watch for while using this medication? Tell your care team if your symptoms do not get better or if they get worse. Visit your care team for regular checks on your progress. Because it may take several weeks to see the full effects of this medication, it is important to continue your treatment as prescribed by your care  team. Watch for new or worsening   thoughts of suicide or depression. This includes sudden changes in mood, behaviors, or thoughts. These changes can happen at any time but are more common in the beginning of treatment or after a change in dose. Call your care team right away if you experience these thoughts or worsening depression. Manic episodes may happen in patients with bipolar disorder who take this medication. Watch for changes in feelings or behaviors such as feeling anxious, nervous, agitated, panicky, irritable, hostile, aggressive, impulsive, severely restless, overly excited and hyperactive, or trouble sleeping. These symptoms can happen at any time but are more common in the beginning of treatment or after a change in dose. Call your care team right away if you notice any of these symptoms. You may get drowsy or dizzy. Do not drive, use machinery, or do anything that needs mental alertness until you know how this medication affects you. Do not stand or sit up quickly, especially if you are an older patient. This reduces the risk of dizzy or fainting spells. Alcohol may interfere with the effect of this medication. Avoid alcoholic drinks. Your mouth may get dry. Chewing sugarless gum or sucking hard candy, and drinking plenty of water may help. Contact your care team if the problem does not go away or is severe. What side effects may I notice from receiving this medication? Side effects that you should report to your care team as soon as possible: Allergic reactions--skin rash, itching, hives, swelling of the face, lips, tongue, or throat Bleeding--bloody or black, tar-like stools, red or dark brown urine, vomiting blood or brown material that looks like coffee grounds, small, red or purple spots on skin, unusual bleeding or bruising Heart rhythm changes--fast or irregular heartbeat, dizziness, feeling faint or lightheaded, chest pain, trouble breathing Low sodium level--muscle weakness, fatigue,  dizziness, headache, confusion Serotonin syndrome--irritability, confusion, fast or irregular heartbeat, muscle stiffness, twitching muscles, sweating, high fever, seizure, chills, vomiting, diarrhea Sudden eye pain or change in vision such as blurry vision, seeing halos around lights, vision loss Thoughts of suicide or self-harm, worsening mood, feelings of depression Side effects that usually do not require medical attention (report to your care team if they continue or are bothersome): Change in sex drive or performance Diarrhea Excessive sweating Nausea Tremors or shaking Upset stomach This list may not describe all possible side effects. Call your doctor for medical advice about side effects. You may report side effects to FDA at 1-800-FDA-1088. Where should I keep my medication? Keep out of reach of children and pets. Store at room temperature between 15 and 30 degrees C (59 and 86 degrees F). Throw away any unused medication after the expiration date. NOTE: This sheet is a summary. It may not cover all possible information. If you have questions about this medicine, talk to your doctor, pharmacist, or health care provider.  2022 Elsevier/Gold Standard (2020-09-11 00:00:00)  

## 2021-10-04 NOTE — Progress Notes (Signed)
I connected with  Wendy Wagner on 10/04/21 by a video enabled telemedicine application and verified that I am speaking with the correct person using two identifiers.   I discussed the limitations of evaluation and management by telemedicine. The patient expressed understanding and agreed to proceed.

## 2021-10-04 NOTE — Telephone Encounter (Signed)
Copied from [facility] [id number]. Topic: General - Other >> Oct 04, 2021 10:17 AM Tessa Lerner A wrote: Reason for CRM: The patient would like to be contacted by their PCP about a previously discussed potential prescription for behavioral health concerns  The patient shares that Brush. Edwards instructed the patient to consult their therapist about the prescription first  The patient's therapist has shared that they do not prescribe medications   The patient would like to be contacted by a member of practice staff to discuss further

## 2021-10-04 NOTE — Progress Notes (Signed)
°  [facility] Telephone Note  I connected with Wendy Wagner, on 10/04/2021 at 3:44 PM  telephone and verified that I am speaking with the correct person using two identifiers.   Consent: I discussed the limitations, risks, security and privacy concerns of performing an evaluation and management service by telephone and the availability of in person appointments. I also discussed with the patient that there may be a patient responsible charge related to this service. The patient expressed understanding and agreed to proceed.   Location of Patient: Home  Location of Provider: [facility]   Persons participating in Telemedicine visit: Wendy Myron [name],  NP Llana Aliment , CMA  History of Present Illness: Wendy Wagner is a 55 year old obese female having a visit for depression-previously explained by PCP to continue therapy and discuss medication. Therapist does not prescribe meds. PHQ completed. She denies harm to self or others or any suicidal ideations.     Past Medical History:  Diagnosis Date   Anemia    Back pain    Childhood asthma    GERD (gastroesophageal reflux disease)    Heart murmur    Leg edema    Allergies  Allergen Reactions   No Known Drug Allergy     Current Outpatient Medications on File Prior to Visit  Medication Sig Dispense Refill   metFORMIN (GLUCOPHAGE-XR) 500 MG 24 hr tablet SMARTSIG:1 Tablet(s) By Mouth Every Evening 90 tablet 1   traMADol (ULTRAM) 50 MG tablet Take 1 tablet by mouth every 6 (six) hours as needed.     fluticasone (FLONASE) 50 MCG/ACT nasal spray Place 2 sprays into both nostrils daily. (Patient not taking: Reported on 10/04/2021) 16 g 6   ibuprofen (ADVIL,MOTRIN) 800 MG tablet Take 1 tablet (800 mg total) by mouth every 8 (eight) hours as needed. (Patient not taking: Reported on 10/04/2021) 30 tablet 2   No current facility-administered  medications on file prior to visit.    Observations/Objective: LMP 05/22/2016  No v/s taken   Assessment and Plan: Wendy Wagner was seen today for depression.  Diagnoses and all orders for this visit:  Adjustment disorder with mixed anxiety and depressed mood  Other orders -     escitalopram (LEXAPRO) 10MG  tablet; Take 1 tablets (10 mg total) by mouth daily.     Follow Up Instructions: Keep scheduled appt   I discussed the assessment and treatment plan with the patient. The patient was provided an opportunity to ask questions and all were answered. The patient agreed with the plan and demonstrated an understanding of the instructions.   The patient was advised to call back or seek an in-person evaluation if the symptoms worsen or if the condition fails to improve as anticipated.     I provided 15 minutes total of non-face-to-face time during this encounter including median intraservice time, reviewing previous notes, investigations, ordering medications, medical decision making, coordinating care and patient verbalized understanding at the end of the visit.    This note has been created with [employer], quantity. Any transcriptional errors are unintentional.   [name], NP 10/04/2021, 3:44 PM

## 2021-10-04 NOTE — Telephone Encounter (Signed)
Called patient and decided to make her a tele visit

## 2021-12-02 ENCOUNTER — Other Ambulatory Visit: Payer: Self-pay

## 2021-12-02 ENCOUNTER — Ambulatory Visit (INDEPENDENT_AMBULATORY_CARE_PROVIDER_SITE_OTHER): Payer: 59

## 2021-12-02 ENCOUNTER — Ambulatory Visit (HOSPITAL_COMMUNITY)
Admission: EM | Admit: 2021-12-02 | Discharge: 2021-12-02 | Disposition: A | Discharge status: Home / Self Care | Payer: 59 | Attending: Family Medicine | Admitting: Family Medicine

## 2021-12-02 ENCOUNTER — Encounter (HOSPITAL_COMMUNITY): Payer: Self-pay | Admitting: Emergency Medicine

## 2021-12-02 DIAGNOSIS — M25562 Pain in left knee: Secondary | ICD-10-CM

## 2021-12-02 MED ORDER — KETOROLAC TROMETHAMINE 30 MG/ML IJ SOLN
30.0000 mg | Freq: Once | INTRAMUSCULAR | Status: AC
  Administered 2021-12-02: 30 mg via INTRAMUSCULAR

## 2021-12-02 MED ORDER — IBUPROFEN 800 MG PO TABS: 800.0000 mg | ORAL_TABLET | Freq: Three times a day (TID) | ORAL | 0 refills | Status: DC | PRN

## 2021-12-02 MED ORDER — KETOROLAC TROMETHAMINE 30 MG/ML IJ SOLN
INTRAMUSCULAR | Status: AC
  Filled 2021-12-02: qty 1

## 2021-12-02 NOTE — Discharge Instructions (Addendum)
Rays do not show any broken bones.  They do show a good bit of arthritis  Continue taking the tramadol as needed that you are prescribed from other providers.  I have also sent in some ibuprofen 800 mg, 1 every 8 hours as needed for pain   You have been given a shot of Toradol this evening for pain

## 2021-12-02 NOTE — ED Provider Notes (Signed)
[place]    CSN: [id number] Arrival date & time: 12/02/21  1611      History   Chief Complaint Chief Complaint  Patient presents with   Knee Pain    HPI Wendy Wagner is a 56 y.o. female.    Knee Pain Here for left knee pain that is been bothering her for 3 weeks.  She states that it began after she had a 6-hour car trip.  No fall or trauma.  She states at first it was hard to bend it and that it did have some pain down into the top of the left calf.  That has resolved.  It was never really swollen around her ankle.  No fever or chills.  No dyspnea    Past Medical History:  Diagnosis Date   Anemia    Back pain    Childhood asthma    GERD (gastroesophageal reflux disease)    Heart murmur    Leg edema     Patient Active Problem List   Diagnosis Date Noted   Cough 10/17/2020   Chronic pain of both shoulders 12/11/2018   Prediabetes 12/11/2018   Childhood asthma 02/09/2015    Past Surgical History:  Procedure Laterality Date   CESAREAN SECTION     WISDOM TOOTH EXTRACTION      OB History     Gravida  3   Para      Term      Preterm      AB      Living  3      SAB      IAB      Ectopic      Multiple      Live Births               Home Medications    Prior to Admission medications   Medication Sig Start Date End Date Taking? Authorizing Provider  ibuprofen (ADVIL) 800 MG tablet Take 1 tablet (800 mg total) by mouth every 8 (eight) hours as needed (pain). 12/02/21  Yes Barrett Henle, MD  escitalopram (LEXAPRO) 10 MG tablet Take 1 tablet (10 mg total) by mouth daily. 10/04/21   [name], NP  fluticasone (FLONASE) 50 MCG/ACT nasal spray Place 2 sprays into both nostrils daily. Patient not taking: Reported on 10/04/2021 10/17/20   Mayers, Cari S, PA-C  metFORMIN (GLUCOPHAGE-XR) 500 MG 24 hr tablet SMARTSIG:1 Tablet(s) By Mouth Every Evening 06/28/21   [name], NP    Family History Family  History  Problem Relation Age of Onset   Cancer Mother        brain   Cancer Father        brain, lung   Alcoholism Father    Diabetes Neg Hx    Heart disease Neg Hx    Stroke Neg Hx     Social History Social History   Tobacco Use   Smoking status: Former    Packs/day: 1.00    Years: 8.00    Pack years: 8.00    Types: Cigarettes   Smokeless tobacco: Never  Vaping Use   Vaping Use: Never used  Substance Use Topics   Alcohol use: No    Alcohol/week: 0.0 standard drinks   Drug use: No     Allergies   No known drug allergy   Review of Systems Review of Systems   Physical Exam Triage Vital Signs ED Triage Vitals  Enc Vitals Group  BP 12/02/21 1702 (!) 143/86     Pulse Rate 12/02/21 1702 69     Resp 12/02/21 1702 20     Temp 12/02/21 1702 98.4 F (36.9 C)     Temp Source 12/02/21 1702 Oral     SpO2 12/02/21 1702 100 %     Weight --      Height --      Head Circumference --      Peak Flow --      Pain Score 12/02/21 1659 8     Pain Loc --      Pain Edu? --      Excl. in [place]? --    No data found.  Updated Vital Signs BP (!) 143/86 (BP Location: Right Arm) Comment (BP Location): regular on forearm   Pulse 69    Temp 98.4 F (36.9 C) (Oral)    Resp 20    LMP 05/22/2016    SpO2 100%   Visual Acuity Right Eye Distance:   Left Eye Distance:   Bilateral Distance:    Right Eye Near:   Left Eye Near:    Bilateral Near:     Physical Exam Vitals reviewed.  Constitutional:      General: She is not in acute distress.    Appearance: She is not ill-appearing, toxic-appearing or diaphoretic.  Cardiovascular:     Rate and Rhythm: Normal rate and regular rhythm.  Pulmonary:     Effort: Pulmonary effort is normal.     Breath sounds: Normal breath sounds.  Musculoskeletal:     Comments: There is some slight effusion of the left knee.  No erythema.  There is medial joint line tenderness.  And some tenderness in the popliteal fossa on the left leg.  There is  no calf tenderness or or ankle swelling at all on either side  Skin:    Coloration: Skin is not jaundiced or pale.  Neurological:     Mental Status: She is alert and oriented to person, place, and time.  Psychiatric:        Behavior: Behavior normal.     UC Treatments / Results  Labs (all labs ordered are listed, but only abnormal results are displayed) Labs Reviewed - No data to display  EKG   Radiology DG Knee 2 Views Left  Result Date: 12/02/2021 CLINICAL DATA:  Left knee pain EXAM: LEFT KNEE - 1-2 VIEW COMPARISON:  11/12/2017 FINDINGS: Degenerative changes in the left knee with joint space narrowing and spurring most pronounced in the patellofemoral compartment. No acute bony abnormality. Specifically, no fracture, subluxation, or dislocation. No joint effusion. IMPRESSION: No acute bony abnormality. Electronically Signed   By: Rolm Baptise M.D.   On: 12/02/2021 18:04    Procedures Procedures (including critical care time)  Medications Ordered in UC Medications  ketorolac (TORADOL) 30 MG/ML injection 30 mg (has no administration in time range)    Initial Impression / Assessment and Plan / UC Course  I have reviewed the triage vital signs and the nursing notes.  Pertinent labs & imaging results that were available during my care of the patient were reviewed by me and considered in my medical decision making (see chart for details).     X-rays show a good bit of osteoarthritis.  No fractures.  Request has been made to help her find a PCP. Final Clinical Impressions(s) / UC Diagnoses   Final diagnoses:  Acute pain of left knee     Discharge Instructions  Rays do not show any broken bones.  They do show a good bit of arthritis  Continue taking the tramadol as needed that you are prescribed from other providers.  I have also sent in some ibuprofen 800 mg, 1 every 8 hours as needed for pain   You have been given a shot of Toradol this evening for  pain     ED Prescriptions     Medication Sig Dispense Auth. Provider   ibuprofen (ADVIL) 800 MG tablet Take 1 tablet (800 mg total) by mouth every 8 (eight) hours as needed (pain). 21 tablet Amana Bouska, Gwenlyn Perking, MD      PDMP not reviewed this encounter.   Barrett Henle, MD 12/04/21 [phone]

## 2021-12-02 NOTE — ED Triage Notes (Signed)
Left knee pain .  Onset 11/18/2021 of pain and no known injury.  When pain started, patient had been on a 6 hour road trip.

## 2021-12-04 ENCOUNTER — Telehealth (HOSPITAL_COMMUNITY): Payer: Self-pay | Admitting: Family Medicine

## 2021-12-04 ENCOUNTER — Ambulatory Visit (HOSPITAL_COMMUNITY)
Admission: EM | Admit: 2021-12-04 | Discharge: 2021-12-04 | Disposition: A | Discharge status: Home / Self Care | Payer: 59 | Attending: Family Medicine | Admitting: Family Medicine

## 2021-12-04 MED ORDER — NAPROXEN 500 MG PO TABS: 500.0000 mg | ORAL_TABLET | Freq: Two times a day (BID) | ORAL | 0 refills | Status: DC

## 2021-12-04 NOTE — Telephone Encounter (Signed)
Pt called, stating ibuprofen not helping. Also tramadol from her primary not helping. Naproxen sent instead today.

## 2021-12-26 ENCOUNTER — Ambulatory Visit (INDEPENDENT_AMBULATORY_CARE_PROVIDER_SITE_OTHER): Payer: 59 | Admitting: Primary Care

## 2022-01-08 ENCOUNTER — Other Ambulatory Visit (INDEPENDENT_AMBULATORY_CARE_PROVIDER_SITE_OTHER): Payer: Self-pay | Admitting: Primary Care

## 2022-01-08 NOTE — Telephone Encounter (Signed)
Routed to PCP 

## 2022-10-23 ENCOUNTER — Other Ambulatory Visit (INDEPENDENT_AMBULATORY_CARE_PROVIDER_SITE_OTHER): Payer: Self-pay | Admitting: Primary Care

## 2022-10-23 NOTE — Telephone Encounter (Signed)
Requested medication (s) are due for refill today: yes  Requested medication (s) are on the active medication list: yes  Last refill:  01/09/22  Future visit scheduled: no  Notes to clinic:  Unable to refill per protocol, appointment needed.      Requested Prescriptions  Pending Prescriptions Disp Refills   escitalopram (LEXAPRO) 10 MG tablet [Pharmacy Med Name: ESCITALOPRAM '10MG'$  TABLETS] 90 tablet 1    Sig: TAKE 1 TABLET(10 MG) BY MOUTH DAILY     Psychiatry:  Antidepressants - SSRI Failed - 10/23/2022 11:28 AM      Failed - Valid encounter within last 6 months    Recent Outpatient Visits           1 year ago Adjustment disorder with mixed anxiety and depressed mood   [facility] [name], NP   1 year ago Adjustment disorder with mixed anxiety and depressed mood   [facility] [name], NP   1 year ago Prediabetes   [place] [name], NP   1 year ago Cough   [place] [name], NP   2 years ago Cough   Primary Care at [facility], Benjamin Perez, [place]

## 2022-12-11 ENCOUNTER — Ambulatory Visit
Admission: EM | Admit: 2022-12-11 | Discharge: 2022-12-11 | Disposition: A | Discharge status: Home / Self Care | Payer: BLUE CROSS/BLUE SHIELD | Attending: Emergency Medicine | Admitting: Emergency Medicine

## 2022-12-11 DIAGNOSIS — H6993 Unspecified Eustachian tube disorder, bilateral: Secondary | ICD-10-CM | POA: Diagnosis not present

## 2022-12-11 DIAGNOSIS — M545 Low back pain, unspecified: Secondary | ICD-10-CM

## 2022-12-11 DIAGNOSIS — R0981 Nasal congestion: Secondary | ICD-10-CM | POA: Diagnosis not present

## 2022-12-11 DIAGNOSIS — G8929 Other chronic pain: Secondary | ICD-10-CM

## 2022-12-11 MED ORDER — AZITHROMYCIN 250 MG PO TABS: 250.0000 mg | ORAL_TABLET | Freq: Every day | ORAL | 0 refills | Status: DC

## 2022-12-11 MED ORDER — IPRATROPIUM BROMIDE 0.03 % NA SOLN: 2.0000 | Freq: Two times a day (BID) | NASAL | 12 refills | Status: AC

## 2022-12-11 MED ORDER — TRAMADOL HCL 50 MG PO TABS: 50.0000 mg | ORAL_TABLET | Freq: Four times a day (QID) | ORAL | 0 refills | Status: AC | PRN

## 2022-12-11 NOTE — ED Triage Notes (Signed)
Pt presents with bilateral ear fullness and pain for past few days; pt recently treated for sinus infection.

## 2022-12-11 NOTE — ED Provider Notes (Signed)
[facility]    CSN: [id number] Arrival date & time: 12/11/22  1358      History   Chief Complaint Chief Complaint  Patient presents with   Otalgia   Ear Fullness    HPI Aireal Scheffler is a 57 y.o. female.   Patient presents for evaluation of bilateral ear fullness, pain and decreased hearing for 7 days.  Associated nasal congestion, postnasal drip.  Completed a telehealth visit within the last week, noted to have a sinus infection and was started on loratadine which has been ineffective.  Denies fevers, sore throat, cough.  Patient has chronic low back pain, requesting refill on tramadol, has been seeing orthopedic doctor regularly but is in the process of changing providers due to insurance coverage.  Past Medical History:  Diagnosis Date   Anemia    Back pain    Childhood asthma    GERD (gastroesophageal reflux disease)    Heart murmur    Leg edema     Patient Active Problem List   Diagnosis Date Noted   Cough 10/17/2020   Chronic pain of both shoulders 12/11/2018   Prediabetes 12/11/2018   Childhood asthma 02/09/2015    Past Surgical History:  Procedure Laterality Date   CESAREAN SECTION     WISDOM TOOTH EXTRACTION      OB History     Gravida  3   Para      Term      Preterm      AB      Living  3      SAB      IAB      Ectopic      Multiple      Live Births               Home Medications    Prior to Admission medications   Medication Sig Start Date End Date Taking? Authorizing Provider  escitalopram (LEXAPRO) 10 MG tablet TAKE 1 TABLET(10 MG) BY MOUTH DAILY 01/09/22   [name], NP  fluticasone (FLONASE) 50 MCG/ACT nasal spray Place 2 sprays into both nostrils daily. Patient not taking: Reported on 10/04/2021 10/17/20   Mayers, Cari S, PA-C  metFORMIN (GLUCOPHAGE-XR) 500 MG 24 hr tablet SMARTSIG:1 Tablet(s) By Mouth Every Evening 06/28/21   [name], NP    Family History Family History   Problem Relation Age of Onset   Cancer Mother        brain   Cancer Father        brain, lung   Alcoholism Father    Diabetes Neg Hx    Heart disease Neg Hx    Stroke Neg Hx     Social History Social History   Tobacco Use   Smoking status: Former    Packs/day: 1.00    Years: 8.00    Total pack years: 8.00    Types: Cigarettes   Smokeless tobacco: Never  Vaping Use   Vaping Use: Never used  Substance Use Topics   Alcohol use: No    Alcohol/week: 0.0 standard drinks of alcohol   Drug use: No     Allergies   No known drug allergy   Review of Systems Review of Systems   Physical Exam Triage Vital Signs ED Triage Vitals [12/11/22 1412]  Enc Vitals Group     BP (!) 154/71     Pulse Rate 77     Resp 17     Temp 98.2 F (36.8  C)     Temp Source Oral     SpO2 96 %     Weight      Height      Head Circumference      Peak Flow      Pain Score      Pain Loc      Pain Edu?      Excl. in [place]?    No data found.  Updated Vital Signs BP (!) 154/71 (BP Location: Left Arm)   Pulse 77   Temp 98.2 F (36.8 C) (Oral)   Resp 17   LMP 05/22/2016   SpO2 96%   Visual Acuity Right Eye Distance:   Left Eye Distance:   Bilateral Distance:    Right Eye Near:   Left Eye Near:    Bilateral Near:     Physical Exam Constitutional:      Appearance: Normal appearance.  HENT:     Right Ear: Tympanic membrane, ear canal and external ear normal.     Left Ear: Tympanic membrane, ear canal and external ear normal.     Nose: Congestion present. No rhinorrhea.     Right Sinus: No maxillary sinus tenderness or frontal sinus tenderness.     Left Sinus: No maxillary sinus tenderness or frontal sinus tenderness.     Mouth/Throat:     Mouth: Mucous membranes are moist.     Pharynx: Oropharynx is clear.  Pulmonary:     Effort: Pulmonary effort is normal.  Musculoskeletal:     Comments: Tenderness is present along the bilateral lower back without ecchymosis, swelling or  deformity, negative straight leg test, able to sit erect without complication, able to twist turn and bend  Neurological:     Mental Status: She is alert and oriented to person, place, and time. Mental status is at baseline.      UC Treatments / Results  Labs (all labs ordered are listed, but only abnormal results are displayed) Labs Reviewed - No data to display  EKG   Radiology No results found.  Procedures Procedures (including critical care time)  Medications Ordered in UC Medications - No data to display  Initial Impression / Assessment and Plan / UC Course  I have reviewed the triage vital signs and the nursing notes.  Pertinent labs & imaging results that were available during my care of the patient were reviewed by me and considered in my medical decision making (see chart for details).  Bilateral eustachian tube dysfunction, redness congestion, chronic low back pain  No abnormalities are noted to the bilateral ears, most likely result of congestion, discussed with patient, azithromycin prescribed as well as Atrovent nasal spray and advised continued use of loratadine for additional support, may use over-the-counter Mucinex and warm compresses to the external ear for pain management, may follow-up as needed if symptoms continue to persist  PDMP reviewed, verified as patient has been taking tramadol regularly, medication refilled, 30 tablets to be dispensed, given walking referral to orthopedics to reestablish care Final Clinical Impressions(s) / UC Diagnoses   Final diagnoses:  None   Discharge Instructions   None    ED Prescriptions   None    PDMP not reviewed this encounter.   Hans Eden, NP 12/11/22 1446

## 2022-12-11 NOTE — Discharge Instructions (Addendum)
Exam there are no signs of infection to your ears and symptoms are most likely related to congestion within the sinuses  Begin azithromycin taking as directed to provide coverage for bacteria which may be adding to your symptoms  Begin use of nasal spray every morning and every evening to further help clear sinus congestion  You may continue use of loratadine which helps to decrease the amount of congestion that is present  You may take Tylenol and/or Motrin every 6 hours as needed for pain  May hold warm compresses to the external ear for comfort  May follow-up as needed if symptoms persist or worsen   For your back pain, tramadol has been prescribed, may use every 6 hours  You have been given information to local orthopedic offices so that you may reestablish care

## 2022-12-24 ENCOUNTER — Ambulatory Visit: Payer: BLUE CROSS/BLUE SHIELD | Admitting: Family Medicine

## 2024-10-01 ENCOUNTER — Encounter (HOSPITAL_COMMUNITY): Payer: Self-pay

## 2024-10-01 ENCOUNTER — Ambulatory Visit (HOSPITAL_COMMUNITY): Admission: EM | Admit: 2024-10-01 | Discharge: 2024-10-01 | Disposition: A | Discharge status: Home / Self Care

## 2024-10-01 DIAGNOSIS — R509 Fever, unspecified: Secondary | ICD-10-CM | POA: Diagnosis not present

## 2024-10-01 DIAGNOSIS — R062 Wheezing: Secondary | ICD-10-CM | POA: Diagnosis not present

## 2024-10-01 DIAGNOSIS — J101 Influenza due to other identified influenza virus with other respiratory manifestations: Secondary | ICD-10-CM

## 2024-10-01 LAB — POC COVID19/FLU A&B COMBO
Covid Antigen, POC: NEGATIVE
Influenza A Antigen, POC: POSITIVE — AB
Influenza B Antigen, POC: NEGATIVE

## 2024-10-01 MED ORDER — PROMETHAZINE-DM 6.25-15 MG/5ML PO SYRP: 5.0000 mL | ORAL_SOLUTION | Freq: Two times a day (BID) | ORAL | 0 refills | Status: AC | PRN

## 2024-10-01 MED ORDER — OSELTAMIVIR PHOSPHATE 75 MG PO CAPS: 75.0000 mg | ORAL_CAPSULE | Freq: Two times a day (BID) | ORAL | 0 refills | Status: AC

## 2024-10-01 MED ORDER — ACETAMINOPHEN 325 MG PO TABS
975.0000 mg | ORAL_TABLET | Freq: Once | ORAL | Status: AC
  Administered 2024-10-01: 975 mg via ORAL

## 2024-10-01 MED ORDER — ALBUTEROL SULFATE HFA 108 (90 BASE) MCG/ACT IN AERS
INHALATION_SPRAY | RESPIRATORY_TRACT | Status: AC
  Filled 2024-10-01: qty 6.7

## 2024-10-01 MED ORDER — ALBUTEROL SULFATE HFA 108 (90 BASE) MCG/ACT IN AERS
2.0000 | INHALATION_SPRAY | Freq: Once | RESPIRATORY_TRACT | Status: AC
  Administered 2024-10-01: 2 via RESPIRATORY_TRACT

## 2024-10-01 MED ORDER — BUDESONIDE-FORMOTEROL FUMARATE 80-4.5 MCG/ACT IN AERO: 2.0000 | INHALATION_SPRAY | Freq: Two times a day (BID) | RESPIRATORY_TRACT | 0 refills | Status: AC | PRN

## 2024-10-01 MED ORDER — ACETAMINOPHEN 325 MG PO TABS
ORAL_TABLET | ORAL | Status: AC
  Filled 2024-10-01: qty 3

## 2024-10-01 NOTE — Discharge Instructions (Signed)
 You tested positive for influenza.  Placed on Tamiflu  twice daily for 5 days.  As we discussed, this can cause stomach upset as well as abnormal dreams and if you develop any side effects stop the medication and be seen.  Alternate Tylenol  and ibuprofen  for fever and pain.  Use Promethazine  DM for cough.  This will make you sleepy so do not drive or drink alcohol taking it.  Use the albuterol  inhaler every 4-6 hours as needed.  I have also called in Symbicort  to help with your symptoms.  You can use this twice a day to help open up your airways but it is important that you rinse your mouth following use of this medication to prevent thrush.  If you are not feeling better within a week please return for reevaluation.  If anything worsens and you have high fever not responding to medication, chest pain, shortness of breath, weakness you need to be seen in the emergency room.

## 2024-10-01 NOTE — ED Triage Notes (Signed)
 Patient presenting with cough, body aches, hot flashes, chills, and fatigue onset 3 days ago. Patient states her husband was sick first. He is being seen today as well.   Patient tried Alka seltzer cold and bc powder with little relief of body aches.

## 2024-10-01 NOTE — ED Provider Notes (Signed)
 " [facility]    CSN: [id number] Arrival date & time: 10/01/24  0808      History   Chief Complaint Chief Complaint  Patient presents with   Cough    HPI Wendy Wagner is a 58 y.o. female.   Patient presents today with a 2 to 3-day history of URI symptoms.  She reports cough, body aches, fever, chills, fatigue.  Denies any nausea, vomiting, diarrhea, chest pain, shortness of breath.  Her husband has been sick with similar symptoms but she does not know what he was diagnosed with as he is currently being evaluated in our clinic.  She has not had COVID and has not had COVID-19 vaccines.  She does report a history of childhood asthma but does not have an albuterol  inhaler available.  She was hospitalized many times as a child for asthma but not in adulthood.  She denies any recent steroids.  She has been treated around Thanksgiving for sinus infection (08/26/2024) with azithromycin .  Denies additional antibiotics in the past 90 days.  She has been taking Alka-Seltzer cold and flu medication as well as BC powders without improvement of symptoms; her last dose of medication was yesterday evening and has not had anything today.    Past Medical History:  Diagnosis Date   Anemia    Back pain    Childhood asthma    GERD (gastroesophageal reflux disease)    Heart murmur    Leg edema     Patient Active Problem List   Diagnosis Date Noted   Cough 10/17/2020   Chronic pain of both shoulders 12/11/2018   Prediabetes 12/11/2018   Childhood asthma 02/09/2015    Past Surgical History:  Procedure Laterality Date   CESAREAN SECTION     WISDOM TOOTH EXTRACTION      OB History     Gravida  3   Para      Term      Preterm      AB      Living  3      SAB      IAB      Ectopic      Multiple      Live Births               Home Medications    Prior to Admission medications  Medication Sig Start Date End Date Taking? Authorizing Provider   budesonide -formoterol  (SYMBICORT ) 80-4.5 MCG/ACT inhaler Inhale 2 puffs into the lungs 2 (two) times daily as needed. 10/01/24  Yes Trice Aspinall K, PA-C  escitalopram  (LEXAPRO ) 10 MG tablet TAKE 1 TABLET(10 MG) BY MOUTH DAILY 01/09/22  Yes Celestia Rosaline SQUIBB, NP  fluticasone  (FLONASE ) 50 MCG/ACT nasal spray Place 2 sprays into both nostrils daily. 10/17/20  Yes Mayers, Cari S, PA-C  ipratropium (ATROVENT ) 0.03 % nasal spray Place 2 sprays into both nostrils every 12 (twelve) hours. 12/11/22  Yes Teresa Shelba SAUNDERS, NP  metFORMIN  (GLUCOPHAGE -XR) 500 MG 24 hr tablet SMARTSIG:1 Tablet(s) By Mouth Every Evening 06/28/21  Yes Celestia Rosaline SQUIBB, NP  oseltamivir  (TAMIFLU ) 75 MG capsule Take 1 capsule (75 mg total) by mouth every 12 (twelve) hours. 10/01/24  Yes Dalten Ambrosino K, PA-C  promethazine -dextromethorphan (PROMETHAZINE -DM) 6.25-15 MG/5ML syrup Take 5 mLs by mouth 2 (two) times daily as needed for cough. 10/01/24  Yes Rami Waddle K, PA-C  traMADol  (ULTRAM ) 50 MG tablet Take 1 tablet (50 mg total) by mouth every 6 (six) hours as needed. 12/11/22  Yes  White, Adrienne R, NP  Vitamin D , Ergocalciferol , (DRISDOL ) 1.25 MG (50000 UNIT) CAPS capsule Take 50,000 Units by mouth every 7 (seven) days. 07/29/24  Yes [provider]    Family History Family History  Problem Relation Age of Onset   Cancer Mother        brain   Cancer Father        brain, lung   Alcoholism Father    Diabetes Neg Hx    Heart disease Neg Hx    Stroke Neg Hx     Social History Social History[1]   Allergies   No known drug allergy   Review of Systems Review of Systems  Constitutional:  Positive for activity change, chills, fatigue and fever. Negative for appetite change.  HENT:  Positive for congestion. Negative for sinus pressure, sneezing and sore throat.   Respiratory:  Positive for cough. Negative for shortness of breath.   Cardiovascular:  Negative for chest pain.  Gastrointestinal:  Negative for  diarrhea, nausea and vomiting.  Musculoskeletal:  Positive for arthralgias and myalgias.  Neurological:  Positive for headaches. Negative for dizziness and light-headedness.     Physical Exam Triage Vital Signs ED Triage Vitals  Encounter Vitals Group     BP 10/01/24 0859 (!) 150/75     Girls Systolic BP Percentile --      Girls Diastolic BP Percentile --      Boys Systolic BP Percentile --      Boys Diastolic BP Percentile --      Pulse Rate 10/01/24 0859 81     Resp 10/01/24 0859 18     Temp 10/01/24 0859 (!) 100.8 F (38.2 C)     Temp Source 10/01/24 0859 Oral     SpO2 10/01/24 0859 95 %     Weight --      Height 10/01/24 0859 5' 7 (1.702 m)     Head Circumference --      Peak Flow --      Pain Score 10/01/24 0857 7     Pain Loc --      Pain Education --      Exclude from Growth Chart --    No data found.  Updated Vital Signs BP 128/81   Pulse 96   Temp 99.6 F (37.6 C)   Resp 20   Ht 5' 7 (1.702 m)   LMP 05/22/2016   SpO2 96%   BMI 55.07 kg/m   Visual Acuity Right Eye Distance:   Left Eye Distance:   Bilateral Distance:    Right Eye Near:   Left Eye Near:    Bilateral Near:     Physical Exam Vitals reviewed.  Constitutional:      General: She is awake. She is not in acute distress.    Appearance: Normal appearance. She is well-developed. She is not ill-appearing.     Comments: Very pleasant female appears stated age in no acute distress sitting comfortably in exam room  HENT:     Head: Normocephalic and atraumatic.     Right Ear: Tympanic membrane, ear canal and external ear normal. Tympanic membrane is not erythematous or bulging.     Left Ear: Tympanic membrane, ear canal and external ear normal. Tympanic membrane is not erythematous or bulging.     Nose:     Right Sinus: No maxillary sinus tenderness or frontal sinus tenderness.     Left Sinus: No maxillary sinus tenderness or frontal sinus tenderness.  Mouth/Throat:     Pharynx: Uvula  midline. No oropharyngeal exudate or posterior oropharyngeal erythema.  Cardiovascular:     Rate and Rhythm: Normal rate and regular rhythm.     Heart sounds: Normal heart sounds, S1 normal and S2 normal. No murmur heard. Pulmonary:     Effort: Pulmonary effort is normal.     Breath sounds: Wheezing present. No rhonchi or rales.     Comments: Scattered wheezing Psychiatric:        Behavior: Behavior is cooperative.      UC Treatments / Results  Labs (all labs ordered are listed, but only abnormal results are displayed) Labs Reviewed  POC COVID19/FLU A&B COMBO - Abnormal; Notable for the following components:      Result Value   Influenza A Antigen, POC Positive (*)    All other components within normal limits    EKG   Radiology No results found.  Procedures Procedures (including critical care time)  Medications Ordered in UC Medications  acetaminophen  (TYLENOL ) tablet 975 mg (975 mg Oral Given 10/01/24 0918)  albuterol  (VENTOLIN  HFA) 108 (90 Base) MCG/ACT inhaler 2 puff (2 puffs Inhalation Given 10/01/24 0919)    Initial Impression / Assessment and Plan / UC Course  I have reviewed the triage vital signs and the nursing notes.  Pertinent labs & imaging results that were available during my care of the patient were reviewed by me and considered in my medical decision making (see chart for details).     Patient is well-appearing, nontoxic, nontachycardic.  She was initially febrile but improved following dose of antipyretics in clinic.  She did have some scattered wheezing but this improved with albuterol  in clinic.  Chest x-ray was deferred as her adventitious lung sounds resolved with albuterol  and her oxygen saturation was appropriate at 96%.  She tested positive for influenza and this within 48 hours of symptom onset so we will start Tamiflu .  No indication for dose adjustment based on metabolic panel from 02/13/2024 with a creatinine of 0.96 and calculated creatinine  clearance of 157 mL/min.  We discussed that this medication can cause GI upset as well as abnormal behavior/treatment she develops any side effect she is to stop the medication and be seen immediately.  I do not think she is currently experiencing an asthma exacerbation but she was given Symbicort  for additional asthma symptom relief and we discussed that she is to rinse her mouth following use of this medication.  She can use over-the-counter medication including Mucinex, Flonase , Tylenol  for additional symptom relief.  She was given (DM for cough and we discussed that this can be sedating and she is not to drive or drink alcohol while taking it.  If she is not feeling better within a week she is to return for reevaluation.  If she has any worsening symptoms she needs to be seen immediately.  Return precautions given.  Excuse note provided.  Final Clinical Impressions(s) / UC Diagnoses   Final diagnoses:  Influenza A  Fever, unspecified  Wheezing     Discharge Instructions      You tested positive for influenza.  Placed on Tamiflu  twice daily for 5 days.  As we discussed, this can cause stomach upset as well as abnormal dreams and if you develop any side effects stop the medication and be seen.  Alternate Tylenol  and ibuprofen  for fever and pain.  Use Promethazine  DM for cough.  This will make you sleepy so do not drive or drink alcohol taking  it.  Use the albuterol  inhaler every 4-6 hours as needed.  I have also called in Symbicort  to help with your symptoms.  You can use this twice a day to help open up your airways but it is important that you rinse your mouth following use of this medication to prevent thrush.  If you are not feeling better within a week please return for reevaluation.  If anything worsens and you have high fever not responding to medication, chest pain, shortness of breath, weakness you need to be seen in the emergency room.     ED Prescriptions     Medication Sig Dispense  Auth. Provider   budesonide -formoterol  (SYMBICORT ) 80-4.5 MCG/ACT inhaler Inhale 2 puffs into the lungs 2 (two) times daily as needed. 1 each Bradee Common K, PA-C   oseltamivir  (TAMIFLU ) 75 MG capsule Take 1 capsule (75 mg total) by mouth every 12 (twelve) hours. 10 capsule Jerett Odonohue K, PA-C   promethazine -dextromethorphan (PROMETHAZINE -DM) 6.25-15 MG/5ML syrup Take 5 mLs by mouth 2 (two) times daily as needed for cough. 118 mL Cameryn Chrisley K, PA-C      PDMP not reviewed this encounter.     [1]  Social History Tobacco Use   Smoking status: Former    Current packs/day: 1.00    Average packs/day: 1 pack/day for 8.0 years (8.0 ttl pk-yrs)    Types: Cigarettes   Smokeless tobacco: Never  Vaping Use   Vaping status: Never Used  Substance Use Topics   Alcohol use: No    Alcohol/week: 0.0 standard drinks of alcohol   Drug use: No     Sherrell Rocky POUR, PA-C 10/01/24 0943  "
